# Patient Record
Sex: Male | Born: 1965 | Race: White | Hispanic: No | Marital: Married | Smoking: Never smoker
Health system: Southern US, Community
[De-identification: ages and names within clinical notes are randomized; demographics above are authoritative.]

## PROBLEM LIST (undated history)

## (undated) DIAGNOSIS — Z8489 Family history of other specified conditions: Secondary | ICD-10-CM

## (undated) DIAGNOSIS — T7840XA Allergy, unspecified, initial encounter: Secondary | ICD-10-CM

## (undated) DIAGNOSIS — J189 Pneumonia, unspecified organism: Secondary | ICD-10-CM

## (undated) DIAGNOSIS — M1990 Unspecified osteoarthritis, unspecified site: Secondary | ICD-10-CM

## (undated) DIAGNOSIS — R7303 Prediabetes: Secondary | ICD-10-CM

## (undated) HISTORY — DX: Unspecified osteoarthritis, unspecified site: M19.90

## (undated) HISTORY — DX: Allergy, unspecified, initial encounter: T78.40XA

## (undated) HISTORY — PX: THUMB AMPUTATION: SHX804

---

## 2006-11-01 ENCOUNTER — Ambulatory Visit: Payer: Self-pay | Admitting: Family Medicine

## 2006-11-05 ENCOUNTER — Encounter: Admission: RE | Admit: 2006-11-05 | Discharge: 2006-11-05 | Payer: Self-pay | Admitting: Family Medicine

## 2010-11-03 ENCOUNTER — Ambulatory Visit: Payer: Self-pay | Admitting: Family Medicine

## 2010-11-03 DIAGNOSIS — J309 Allergic rhinitis, unspecified: Secondary | ICD-10-CM | POA: Insufficient documentation

## 2010-11-03 DIAGNOSIS — R21 Rash and other nonspecific skin eruption: Secondary | ICD-10-CM | POA: Insufficient documentation

## 2010-11-03 DIAGNOSIS — M1990 Unspecified osteoarthritis, unspecified site: Secondary | ICD-10-CM | POA: Insufficient documentation

## 2010-11-06 ENCOUNTER — Encounter (INDEPENDENT_AMBULATORY_CARE_PROVIDER_SITE_OTHER): Payer: Self-pay | Admitting: *Deleted

## 2010-11-07 LAB — CONVERTED CEMR LAB
BUN: 14 mg/dL (ref 6–23)
Calcium: 9.3 mg/dL (ref 8.4–10.5)
Creatinine, Ser: 0.89 mg/dL (ref 0.40–1.50)
Sodium: 138 meq/L (ref 135–145)

## 2010-11-24 ENCOUNTER — Ambulatory Visit: Payer: Self-pay | Admitting: Family Medicine

## 2010-11-24 DIAGNOSIS — R7309 Other abnormal glucose: Secondary | ICD-10-CM | POA: Insufficient documentation

## 2010-11-30 ENCOUNTER — Encounter (INDEPENDENT_AMBULATORY_CARE_PROVIDER_SITE_OTHER): Payer: Self-pay | Admitting: *Deleted

## 2010-11-30 DIAGNOSIS — E119 Type 2 diabetes mellitus without complications: Secondary | ICD-10-CM | POA: Insufficient documentation

## 2010-12-05 ENCOUNTER — Ambulatory Visit: Payer: Self-pay | Admitting: Family Medicine

## 2010-12-05 LAB — HM DIABETES FOOT EXAM

## 2011-01-23 NOTE — Miscellaneous (Signed)
  Clinical Lists Changes  Problems: Added new problem of DIABETES MELLITUS (ICD-250.00)

## 2011-01-23 NOTE — Letter (Signed)
Summary: Generic Letter  [place] [facility]  [address] [place], [place] [address]   Phone: [phone]  Fax: [phone]    11/30/2010    Nedim Sabino [address] [place], [place]  [address]    Dear Mr. Flax,  We have been unable to reach you by phone.  If your phone number has changed, please notify our office as it is important that we be able to contact  you if necessary. Numerous messages have been left on your voicemail with no response.  Your blood sugar is elevated.  You have Diabetes Mellitus. You need a  30 minute  appointment with me to discuss it.  You do not need to come in fasting.  Please call the office to schedule that appointment and be sure to ask the receptionist to make it a 30 minute block of time to allow adequate time to speak with you.   Sincerely,     Dwana Curd. Para March, M.D.  [facility]

## 2011-01-23 NOTE — Assessment & Plan Note (Signed)
Summary: REESTABH AND FOOT PROBLEMS/DLO   Vital Signs:  Patient profile:   45 year old male Height:      72 inches Weight:      284.75 pounds BMI:     38.76 Temp:     98.4 degrees F oral Pulse rate:   88 / minute Pulse rhythm:   regular BP sitting:   124 / 70  (left arm) Cuff size:   large  Vitals Entered By: Delilah Shan CMA Duncan Dull) (November 03, 2010 3:43 PM) CC: Re-establish.  Foot problem   History of Present Illness: B knee OA, taking naproxen often, up to 3 aleve mult times a day.    B foot abrasion.  B new boots, steel toe.  Started to get irritated 2-3 weeks ago.  H/o foot sweating.  prev cracked chapped and bleeding.  R>L foot symptoms.  On top of foot.   No fevers.  No purulent discharge.   Preventive Screening-Counseling & Management  Alcohol-Tobacco     Smoking Status: never  Caffeine-Diet-Exercise     Does Patient Exercise: no      Drug Use:  no.    Current Medications (verified): 1)  Naproxen Sodium 220 Mg Tabs (Naproxen Sodium) .... Daily 2)  Triamcinolone Acetonide 0.5 % Crea (Triamcinolone Acetonide) .... Aaa Two Times A Day 3)  Tramadol Hcl 50 Mg Tabs (Tramadol Hcl) .Marland Kitchen.. 1 By Mouth Two To Three Times A Day For Knee Pain.  Allergies (verified): 1)  ! Penicillin  Past History:  Past Medical History: OSTEOARTHRITIS (ICD-715.90) ALLERGIC RHINITIS (ICD-477.9)    Past Surgical History: L thumb reattached in HS  Family History: Reviewed history and no changes required. Family History Lung cancer, parents M dead, lung CA F dead, COPD, suicide 5 brothers, 1 with CAD, 1 with COPD  Social History: Reviewed history and no changes required. Occupation:  Psychologist, occupational Education:  [employer] Single Never Smoked Alcohol use-yes, rare Drug use-no Regular exercise-no enjoys drag racing lives with 2 brothersOccupation:  employed Smoking Status:  never Drug Use:  no Does Patient Exercise:  no  Review of Systems       See HPI.  Otherwise negative.      Physical Exam  General:  no apparent distress mucous membranes moist  regular rate and rhythm  clear to auscultation bilaterally  knees with medial joint line tenderness bilaterally, + crepitus bilaterally but no erythema B feet with sensation intact and good cap refill but irritated, erythematous, excoriated plaque on the dorsum of the midfoot, R>L   Impression & Recommendations:  Problem # 1:  OSTEOARTHRITIS (ICD-715.90) Start tramadol and limit NSAIDS.  check bmet given nsaid use.  His updated medication list for this problem includes:    Naproxen Sodium 220 Mg Tabs (Naproxen sodium) .Marland Kitchen... Daily    Tramadol Hcl 50 Mg Tabs (Tramadol hcl) .Marland Kitchen... 1 by mouth two to three times a day for knee pain.  Orders: T-Basic Metabolic Panel [id number])  Problem # 2:  SKIN RASH (ICD-782.1) Likely due to irritation, sweat, scratching.  No indication for antibiotics at this point.  use TAC and call back with update as needed.  he agrees.  His updated medication list for this problem includes:    Triamcinolone Acetonide 0.5 % Crea (Triamcinolone acetonide) .Marland Kitchen... Aaa two times a day  Complete Medication List: 1)  Naproxen Sodium 220 Mg Tabs (Naproxen sodium) .... Daily 2)  Triamcinolone Acetonide 0.5 % Crea (Triamcinolone acetonide) .... Aaa two times a day 3)  Tramadol Hcl  50 Mg Tabs (Tramadol hcl) .Marland Kitchen.. 1 by mouth two to three times a day for knee pain.  Patient Instructions: 1)  I would use the cream two times a day.  Don't use alcohol or peroxide on it.  Change your socks at work in the middle of the day.  2)  I would use the tramadol for pain as directed.  Don't take more than 4 aleve in a day.   The tramadol may make you drowsy.   3)  I'll let you know about your labs.  Prescriptions: TRAMADOL HCL 50 MG TABS (TRAMADOL HCL) 1 by mouth two to three times a day for knee pain.  #50 x 1   Entered and Authorized by:   Crawford Givens MD   Signed by:   Crawford Givens MD on 11/03/2010   Method  used:   [facility]        [facility]  [address] (retail)       [address]       [place], [place]  [address]       Ph: [phone] or [phone]       Fax: [phone]   RxID:   [id number] TRIAMCINOLONE ACETONIDE 0.5 % CREA (TRIAMCINOLONE ACETONIDE) AAA two times a day  #80g x 1   Entered and Authorized by:   Crawford Givens MD   Signed by:   Crawford Givens MD on 11/03/2010   Method used:   [facility]        [facility]  [id number]       [address]       [place], [place]  [address]       Ph: [phone] or [phone]       Fax: [phone]   RxID:   [id number]    Orders Added: 1)  Est. Patient Level III [20254] 2)  T-Basic Metabolic Panel [id number]    Current Allergies (reviewed today): ! PENICILLIN

## 2011-01-23 NOTE — Letter (Signed)
Summary: Generic Letter  [place] at [facility]  [address] [place], [place] [address]   Phone: [phone]  Fax: [phone]    11/06/2010    Sherman Aja [address] [place], [place]  [address]    Dear Mr. Hartzell,  We have been unable to reach you by phone.  If your phone number has changed, please notify our office as it is important that we be able to contact  you if necessary.   Your labs are fine except for glucose which is elevated.  You need to come in for fasting glucose. Please call the office to set up a lab appointment only and you will need to have nothing to eat or drink after midnight on the night before your lab appointment.  You may have water or black coffee (no cream or sugar or any artificial sweeteners).  [phone].       Sincerely,   Dwana Curd. Para March, M.D.  [facility]

## 2011-01-25 NOTE — Assessment & Plan Note (Signed)
Summary: 30 MIN PER MD/DLO   Vital Signs:  Patient profile:   45 year old male Height:      72 inches Weight:      282.25 pounds BMI:     38.42 Temp:     98.2 degrees F oral Pulse rate:   84 / minute Pulse rhythm:   regular BP sitting:   122 / 70  (left arm) Cuff size:   large  Vitals Entered By: Delilah Shan CMA Duncan Dull) (December 05, 2010 4:09 PM) CC: 30 minute OV per MD   History of Present Illness: New DM2- 30 lbs intentional weight loss.  portion control and cutting out soda.  Moving better with less weight.  We talked about path/phys of DM today and he understood the basic goals.  We talked about diet, exercise and weight loss.  He knows to avoid sugary, high carb, low fiber foods.  No symptoms currently.  No polyphagia, no polydypsia.   Allergies: 1)  ! Penicillin  Past History:  Past Medical History: OSTEOARTHRITIS (ICD-715.90) ALLERGIC RHINITIS (ICD-477.9)   Diabetes mellitus, type II  Review of Systems       See HPI.  Otherwise negative.    Physical Exam  General:  NAD MMM RRR clear to auscultation bilaterally ext w/o edema.   Diabetes Management Exam:    Foot Exam (with socks and/or shoes not present):       Sensory-Pinprick/Light touch:          Left medial foot (L-4): normal          Left dorsal foot (L-5): normal          Left lateral foot (S-1): normal          Right medial foot (L-4): normal          Right dorsal foot (L-5): normal          Right lateral foot (S-1): normal       Sensory-Monofilament:          Left foot: normal          Right foot: normal       Inspection:          Left foot: normal          Right foot: abnormal             Comments: rash much improved from before, minimal erythema from chapped skin       Nails:          Left foot: normal          Right foot: normal   Impression & Recommendations:  Problem # 1:  DIABETES MELLITUS (ICD-250.00) Assessment New meter rx hand written and instructions given about check  sugar.  he declined referral for DM2 education.  I talked to him about ADA website.  He'll look at that.  Work on diet and exercise in the meantime and we'll notify him about A1c.  See instructions.  No new meds today. >25 min spent with patient, at least half of which was spent on counseling re:dx.  Orders: TLB-A1C / Hgb A1C (Glycohemoglobin) (83036-A1C)  Complete Medication List: 1)  Naproxen Sodium 220 Mg Tabs (Naproxen sodium) .... Daily 2)  Triamcinolone Acetonide 0.5 % Crea (Triamcinolone acetonide) .... Aaa two times a day 3)  Tramadol Hcl 50 Mg Tabs (Tramadol hcl) .Marland Kitchen.. 1 by mouth two to three times a day for knee pain.  Patient Instructions: 1)  We'll contact you with your  lab report.  I want to recheck an A1c on you in 6 months with a OV a few days later. 2)  Check the American Diabetes Association website about food and exercise ideas.  3)  Check your sugar occasionally in the AM and let me know if the numbers are getting higher.  4)  Take care.  Glad to see you today.    Orders Added: 1)  Est. Patient Level IV [56213] 2)  TLB-A1C / Hgb A1C (Glycohemoglobin) [83036-A1C]    Current Allergies (reviewed today): ! PENICILLIN

## 2011-04-03 ENCOUNTER — Other Ambulatory Visit: Payer: Self-pay | Admitting: *Deleted

## 2011-04-03 MED ORDER — GLUCOSE BLOOD VI STRP: ORAL_STRIP | Status: AC

## 2011-05-11 NOTE — Assessment & Plan Note (Signed)
[name]                             [facility] OFFICE NOTE   NAME:[name], [name]                        MRN:          [id number]  DATE:11/01/2006                            DOB:          1966/01/29    [name] is a 45 year old white male who comes to reestablish with the  practice not having been seen since October 28, 2001. He indicates no recent  medical care.   He indicates that he has had back pain with straightening up for the past 1-  2 weeks. He thinks he woke with the pain but he is not sure. The pain  resolves with sitting or lying. He denies any trauma. 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 He  has been taking Naprosyn 220 mg 3 twice a day. He says this helps but does  not relieve.   CURRENT MEDICATIONS:  Nothing other than the OTC Naprosyn.   ALLERGIES:  AUGMENTIN causes palpitations.   PAST MEDICAL HISTORY:  He had pneumonia when he was between 38 and 67 years  old and 2 lumbar bulging disks in 1991-92.   PAST SURGICAL HISTORY:  His only surgery has been reattachment of his left  thumb in 1987. He has had no hospitalizations.   SOCIAL HISTORY:  He is single with no children. He is a Psychologist, occupational and enjoys  drag races. He is in no exercise program. He has never smoked, occasionally  uses alcohol, and does not use street drugs.   REVIEW OF SYSTEMS:  He denies any HEENT, cardiovascular, respiratory, GI or  GU problems. Musculoskeletally, he indicates a fracture of his left upper  and lower leg at age 68. He has low back pain as discussed above. He does  have allergic rhinitis and no history of thromboses.   FAMILY HISTORY:  Father died at age 11 of suicide. He had emphysema, was a  smoker, and had an MI x1. His mother died at age 28 of lung cancer. She was  a smoker and had had osteoporosis. He has 5 brothers all living, one with  prostate cancer and hypertension, a second  brother with bad knees and a  silent MI at approximately age 90. He also had elevated lipids. The third  brother has asthma, brothers 4 and 5 have no known medical problems.   With questions regarding his extended family, he indicates that he has very  little knowledge, but he is not aware of any prostate cancer in the family.   IMMUNIZATIONS:  His last tetanus was approximately 5 years ago. He indicates  that he has had a hepatitis A vaccine but not the B vaccine. He has no  pneumonia vaccine.   PHYSICAL EXAMINATION:  VITAL SIGNS:  Blood pressure 131/86, temperature  98.2, pulse is 102, weight 296, Height 5 feet 9-3/4 inches.  GENERAL:  Obese white male in no acute distress sitting in the exam room.  CHEST:  Clear throughout. No rales, rhonchi or wheezes.  HEART:  Rate and rhythm regular  without murmur, gallop or rub. No carotid  bruits, no pretibial edema.  MUSCULOSKELETAL:  He has pain in his L3 to 4 area with rising from forward  flexion of 90 degrees. The pain occurs at approximately as he stands in an  upright position. He has minimal pain with lateral flexion or with rotation.  There is no pain on straight leg raise, internal or external rotation of the  hip.  SKIN:  Without obvious lesions in the exposed area.  PSYCHIATRIC:  Oriented x3, verbalizes easily.   ASSESSMENT:  1. Low back pain. Plan:  Will get an x-ray of his LS spine, refer as      needed. Skelaxin 800 mg 1 q.i.d. with samples and prescription. He      should use ibuprofen 800 mg every 8 hours or Aleve 2 (Naprosyn) q.12h      with food. I have encouraged him not to overtake medication. He will      use heat/ice/heat or heat as he tolerates it. Will see him back in 7-10      days if not improve.  2. Obesity. He is aware of his weight and indicates that at this point in      time he is not interested in doing anything about it.      Billie D. Bean, FNP  Electronically Signed      Arta Silence, MD   Electronically Signed   BDB/MedQ  DD: 11/12/2006  DT: 11/12/2006  Job #: [id number]

## 2011-05-26 ENCOUNTER — Encounter: Payer: Self-pay | Admitting: Family Medicine

## 2011-06-05 ENCOUNTER — Ambulatory Visit: Payer: Self-pay | Admitting: Family Medicine

## 2011-06-07 ENCOUNTER — Ambulatory Visit (INDEPENDENT_AMBULATORY_CARE_PROVIDER_SITE_OTHER): Payer: 59 | Admitting: Family Medicine

## 2011-06-07 ENCOUNTER — Encounter: Payer: Self-pay | Admitting: Family Medicine

## 2011-06-07 ENCOUNTER — Telehealth: Payer: Self-pay | Admitting: Family Medicine

## 2011-06-07 DIAGNOSIS — Z8639 Personal history of other endocrine, nutritional and metabolic disease: Secondary | ICD-10-CM

## 2011-06-07 DIAGNOSIS — E119 Type 2 diabetes mellitus without complications: Secondary | ICD-10-CM

## 2011-06-07 NOTE — Telephone Encounter (Signed)
Hx updated.

## 2011-06-07 NOTE — Patient Instructions (Signed)
You can get your results through our phone system.  Follow the instructions on the blue card. Take care.  Thanks for you work on your diet and weight.  Glad to see you.  We'll let your know when to come back when I get your labs.

## 2011-06-07 NOTE — Progress Notes (Signed)
Diabetes: lost ~100lbs intentionally Using medications without difficulties:no meds Hypoglycemic episodes:no Hyperglycemic episodes:no Feet problems:no Blood Sugars averaging: 70-90 fasting  PMH and SH reviewed  Meds, vitals, and allergies reviewed.   ROS: See HPI.  Otherwise negative.    GEN: nad, alert and oriented HEENT: mucous membranes moist NECK: supple w/o LA CV: rrr. PULM: ctab, no inc wob ABD: soft, +bs EXT: no edema SKIN: no acute rash  Diabetic foot exam: Normal inspection No skin breakdown No calluses  Normal DP pulses Normal sensation to light touch and monofilament Nails normal

## 2011-06-07 NOTE — Assessment & Plan Note (Signed)
Check A1c.  Profound weight loss with diet and exercise.  I thanked the pt. See notes on labs.

## 2012-09-03 ENCOUNTER — Encounter: Payer: Self-pay | Admitting: Family Medicine

## 2012-09-03 ENCOUNTER — Ambulatory Visit (INDEPENDENT_AMBULATORY_CARE_PROVIDER_SITE_OTHER): Payer: 59 | Admitting: Family Medicine

## 2012-09-03 VITALS — BP 122/74 | HR 76 | Temp 98.5°F | Wt 206.0 lb

## 2012-09-03 DIAGNOSIS — Z209 Contact with and (suspected) exposure to unspecified communicable disease: Secondary | ICD-10-CM

## 2012-09-03 DIAGNOSIS — M549 Dorsalgia, unspecified: Secondary | ICD-10-CM | POA: Insufficient documentation

## 2012-09-03 MED ORDER — TRAMADOL HCL 50 MG PO TABS: ORAL_TABLET | ORAL | Status: DC

## 2012-09-03 NOTE — Assessment & Plan Note (Signed)
Check labs, notify pt as soon as possible via cell when reviewed.  No obvious STI based on exam.  I don't think he has acute bacterial process with URI sx.  He likely had combination of mild viral sx +/- upheaval from recent developments.

## 2012-09-03 NOTE — Assessment & Plan Note (Signed)
occ tramadol use, continue prn.

## 2012-09-03 NOTE — Progress Notes (Signed)
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  Single partner.  No dysuria.  No rash.  No pain.  Never tested prev.  No h/o STDs.  No testicle pain.   Back pain, lower back. Had some relief with tramadol and needed a refill.  No ADE, not drowsy.    Stomach upset- nauseated w/o vomiting.  Some mild cough, no sputum.  No fevers.  No diarrhea.  He has been shocked by the situation described above .    ROS: See HPI.  Otherwise negative.    Meds, vitals, and allergies reviewed.   GEN: nad, alert and oriented HEENT: mucous membranes moist, TM w/o erythema, nasal epithelium minially injected, OP without cobblestoning NECK: supple w/o LA CV: rrr. PULM: ctab, no inc wob ABD: soft, +bs EXT: no edema Testes bilaterally descended without nodularity, tenderness or masses. No scrotal masses or lesions. No penis lesions or urethral discharge.

## 2012-09-03 NOTE — Patient Instructions (Addendum)
Go to the lab on the way out.  We'll contact you with your lab report. Take care.   

## 2012-09-04 LAB — URINALYSIS, MICROSCOPIC ONLY
Bacteria, UA: NONE SEEN
Casts: NONE SEEN
Squamous Epithelial / LPF: NONE SEEN

## 2012-09-04 LAB — GC/CHLAMYDIA PROBE AMP, URINE: GC Probe Amp, Urine: NEGATIVE

## 2012-09-04 LAB — HSV(HERPES SIMPLEX VRS) I + II AB-IGM: Herpes Simplex Vrs I&II-IgM Ab (EIA): 0.36 INDEX

## 2012-09-04 LAB — HIV ANTIBODY (ROUTINE TESTING W REFLEX): HIV: NONREACTIVE

## 2012-09-04 LAB — RPR

## 2013-01-02 ENCOUNTER — Encounter: Payer: Self-pay | Admitting: Family Medicine

## 2013-01-02 ENCOUNTER — Ambulatory Visit (INDEPENDENT_AMBULATORY_CARE_PROVIDER_SITE_OTHER): Payer: 59 | Admitting: Family Medicine

## 2013-01-02 VITALS — BP 122/70 | HR 74 | Temp 98.0°F | Wt 210.0 lb

## 2013-01-02 DIAGNOSIS — M25519 Pain in unspecified shoulder: Secondary | ICD-10-CM

## 2013-01-02 MED ORDER — TRAMADOL HCL 50 MG PO TABS: ORAL_TABLET | ORAL | Status: DC

## 2013-01-02 NOTE — Progress Notes (Signed)
"  Stomach bug" started about 1.5 weeks ago.  Diarrhea and sour belching.  No fevers now, prev resolved. No more diarrhea.  This is now all resolved.   L shoulder bothering him for "awhile".  Waking up at night from pain.  Pain if abduction >90 deg, weakness if working above his head.  Progressive, more discomfort now.  No recent trauma.  Takes tramadol for pain.    He's getting married in October 2014.   Meds, vitals, and allergies reviewed.   ROS: See HPI.  Otherwise, noncontributory.  nad ncat Neck with normal ROM.  L shoulder with dec in ROM due to pain- at 90deg Pain with int>ext rotation, +scap assist Pain on supraspinatus testing + impingement No arm drop Elbow with normal ROM AC not ttp

## 2013-01-02 NOTE — Patient Instructions (Signed)
Use the exercise and the tramadol for pain.  If not improving, then call back and we'll get you set up with ortho.  Take care.

## 2013-01-03 DIAGNOSIS — M25519 Pain in unspecified shoulder: Secondary | ICD-10-CM | POA: Insufficient documentation

## 2013-01-03 NOTE — Assessment & Plan Note (Signed)
With cuff symptoms.  +scap assist.  D/w pt about options- home exercise with tramadol prn vs ortho referral and tramadol.  He would prefer home exercises.  If failed, then refer to ortho.  Anatomy and exercises discussed, handout given.  He agrees.

## 2013-06-16 ENCOUNTER — Other Ambulatory Visit: Payer: Self-pay | Admitting: Family Medicine

## 2013-06-16 NOTE — Telephone Encounter (Signed)
Electronic refill request.  Please advise. 

## 2013-06-17 NOTE — Telephone Encounter (Signed)
Sent!

## 2013-09-22 ENCOUNTER — Telehealth: Payer: Self-pay

## 2013-09-22 NOTE — Telephone Encounter (Signed)
Pt left v/m requesting refill of medication; pt did not leave name of med requesting or pharmacy. Left v/m for pt to call back.

## 2013-09-24 ENCOUNTER — Other Ambulatory Visit: Payer: Self-pay | Admitting: *Deleted

## 2013-09-24 MED ORDER — TRAMADOL HCL 50 MG PO TABS: ORAL_TABLET | ORAL | Status: DC

## 2013-09-24 NOTE — Telephone Encounter (Signed)
Please call in.  Thanks.   

## 2013-09-25 NOTE — Telephone Encounter (Signed)
Medication phoned to pharmacy.  

## 2013-09-30 ENCOUNTER — Telehealth: Payer: Self-pay

## 2013-09-30 NOTE — Telephone Encounter (Signed)
Opened in error

## 2013-10-26 NOTE — Telephone Encounter (Signed)
Tramadol refilled 09/24/13.

## 2014-01-01 ENCOUNTER — Ambulatory Visit (INDEPENDENT_AMBULATORY_CARE_PROVIDER_SITE_OTHER): Payer: 59 | Admitting: Internal Medicine

## 2014-01-01 ENCOUNTER — Encounter: Payer: Self-pay | Admitting: Internal Medicine

## 2014-01-01 VITALS — BP 128/82 | HR 73 | Temp 98.1°F | Wt 223.5 lb

## 2014-01-01 DIAGNOSIS — M129 Arthropathy, unspecified: Secondary | ICD-10-CM

## 2014-01-01 DIAGNOSIS — Z2089 Contact with and (suspected) exposure to other communicable diseases: Secondary | ICD-10-CM

## 2014-01-01 DIAGNOSIS — Z202 Contact with and (suspected) exposure to infections with a predominantly sexual mode of transmission: Secondary | ICD-10-CM

## 2014-01-01 DIAGNOSIS — M1990 Unspecified osteoarthritis, unspecified site: Secondary | ICD-10-CM

## 2014-01-01 DIAGNOSIS — K1370 Unspecified lesions of oral mucosa: Secondary | ICD-10-CM

## 2014-01-01 DIAGNOSIS — K1379 Other lesions of oral mucosa: Secondary | ICD-10-CM

## 2014-01-01 MED ORDER — DICLOFENAC SODIUM 75 MG PO TBEC: 75.0000 mg | DELAYED_RELEASE_TABLET | Freq: Two times a day (BID) | ORAL | Status: DC

## 2014-01-01 NOTE — Patient Instructions (Signed)
Oral Ulcers °Oral ulcers are painful, shallow sores around the lining of the mouth. They can affect the gums, the inside of the lips and the cheeks (sores on the outside of the lips and on the face are different). They typically first occur in school aged children and teenagers. Oral ulcers may also be called canker sores or cold sores. °CAUSES  °Canker sores and cold sores can be caused by many factors including: °· Infection. °· Injury. °· Sun exposure. °· Medications. °· Emotional stress. °· Food allergies. °· Vitamin deficiencies. °· Toothpastes containing sodium lauryl sulfate. °The Herpes Virus can be the cause of mouth ulcers. The first infection can be severe and cause 10 or more ulcers on the gums, tongue and lips with fever and difficulty in swallowing. This infection usually occurs between the ages of 1 and 3 years.  °SYMPTOMS  °The typical sore is about ¼ inch (6 mm) in size, is an oval or round ulcer with red borders. °DIAGNOSIS  °Your caregiver can diagnose simple oral ulcers by examination. Additional testing is usually not required.  °TREATMENT  °Treatment is aimed at pain relief. Generally, oral ulcers resolve by themselves within 1 to 2 weeks without medication and are not contagious unless caused by Herpes (and other viruses). Antibiotics are not effective with mouth sores. Avoid direct contact with others until the ulcer is completely healed. See your caregiver for follow-up care as recommended. Also: °· Offer a soft diet. °· Encourage plenty of fluids to prevent dehydration. Popsicles and milk shakes can be helpful. °· Avoid acidic and salty foods and drinks such as orange juice. °· Infants and young children will often refuse to drink because of pain. Using a teaspoon, cup or syringe to give small amounts of fluids frequently can help prevent dehydration. °· Cold compresses on the face may help reduce pain. °· Pain medication can help control soreness. °· A solution of diphenhydramine mixed  with a liquid antacid can be useful to decrease the soreness of ulcers. Consult a caregiver for the dosing. °· Liquids or ointments with a numbing ingredient may be helpful when used as recommended. °· Older children and teenagers can rinse their mouth with a salt-water mixture (1/2 teaspoonof salt in 8 ounces of water) four times a day. This treatment is uncomfortable but may reduce the time the ulcers are present. °· There are many over the counter throat lozenges and medications available for oral ulcers. There effectiveness has not been studied. °· Consult your medical caregiver prior to using homeopathic treatments for oral ulcers. °SEEK MEDICAL CARE IF:  °· You think your child needs to be seen. °· The pain worsens and you cannot control it. °· There are 4 or more ulcers. °· The lips and gums begin to bleed and crust. °· A single mouth ulcer is near a tooth that is causing a toothache or pain. °· Your child has a fever, swollen face, or swollen glands. °· The ulcers began after starting a medication. °· Mouth ulcers keep re-occurring or last more than 2 weeks. °· You think your child is not taking adequate fluids. °SEEK IMMEDIATE MEDICAL CARE IF:  °· Your child has a high fever. °· Your child is unable to swallow or becomes dehydrated. °· Your child looks or acts very ill. °· An ulcer caused by a chemical your child accidentally put in their mouth. °Document Released: 01/17/2005 Document Revised: 03/03/2012 Document Reviewed: 09/01/2009 °[employer]® Patient Information ©2014 [employer]. ° °

## 2014-01-01 NOTE — Progress Notes (Signed)
Subjective:    Patient ID: [name], male    DOB: August 21, 1966, 48 y.o.   MRN: [id number]  HPI  Pt presents to the clinic today with c/o STD exoposure. He reports that his partner has Herpes. Over the last week they have gotten much better and might even be gone. He has never had any genital or oral lesions prior. He would like to be checked for herpes.  Additionally, he would like to switch from naproxen to diclofenac.  Review of Systems      Past Medical History  Diagnosis Date  . Arthritis     osteoarthritis  . Allergy   . Diabetes mellitus     prev, resolved with weight loss 2012    Current Outpatient Prescriptions  Medication Sig Dispense Refill  . naproxen sodium (ANAPROX) 220 MG tablet as needed. daily      . traMADol (ULTRAM) 50 MG tablet 1 BY MOUTH TWO TO THREE TIMES A DAY FOR KNEE OR BACK PAIN AS NEEDED  50 tablet  2   No current facility-administered medications for this visit.    Allergies  Allergen Reactions  . Penicillins     REACTION: "I thought I was having a heart attack"    Family History  Problem Relation Age of Onset  . Cancer Mother     Lung Cancer  . COPD Father   . Heart disease Brother     CAD  . COPD Brother     History   Social History  . Marital Status: Single    Spouse Name: N/A    Number of Children: N/A  . Years of Education: N/A   Occupational History  . Not on file.   Social History Main Topics  . Smoking status: Never Smoker   . Smokeless tobacco: Never Used  . Alcohol Use: Yes     Comment: occ beer  . Drug Use: No  . Sexual Activity: Not on file   Other Topics Concern  . Not on file   Social History Narrative   Single   Lives with 2 brothers    Enjoys drag racing    No regular exercise     Constitutional: Denies fever, malaise, fatigue, headache or abrupt weight changes.  HEENT: Denies eye pain, eye redness, ear pain, ringing in the ears, wax buildup, runny nose, nasal congestion, bloody nose, or sore  throat. GU: Denies urgency, frequency, pain with urination, burning sensation, blood in urine, odor or discharge.  No other specific complaints in a complete review of systems (except as listed in HPI above).  Objective:   Physical Exam  BP 128/82  Pulse 73  Temp(Src) 98.1 F (36.7 C) (Oral)  Wt 223 lb 8 oz (101.379 kg)  SpO2 99% Wt Readings from Last 3 Encounters:  01/01/14 223 lb 8 oz (101.379 kg)  01/02/13 210 lb (95.255 kg)  09/03/12 206 lb (93.441 kg)    General: Appears his stated age, well developed, well nourished in NAD. Skin: Warm, dry and intact. No rashes, lesions or ulcerations noted. HEENT: Head: normal shape and size; Eyes: sclera white, no icterus, conjunctiva pink, PERRLA and EOMs intact; Ears: Tm's gray and intact, normal light reflex; Nose: mucosa pink and moist, septum midline; Throat/Mouth: Teeth present, mucosa pink and moist, no exudate, lesions or ulcerations noted. Small oral ulcer noted on base of tongue.  BMET    Component Value Date/Time   NA 138 11/03/2010 2144   K 4.1 11/03/2010 2144  CL 103 11/03/2010 2144   CO2 25 11/03/2010 2144   GLUCOSE 160* 11/24/2010 0820   BUN 14 11/03/2010 2144   CREATININE 0.89 11/03/2010 2144   CALCIUM 9.3 11/03/2010 2144    Lipid Panel  No results found for this basename: chol, trig, hdl, cholhdl, vldl, ldlcalc    CBC No results found for this basename: wbc, rbc, hgb, hct, plt, mcv, mch, mchc, rdw, neutrabs, lymphsabs, monoabs, eosabs, basosabs    Hgb A1C Lab Results  Component Value Date   HGBA1C 5.1 06/07/2011         Assessment & Plan:   STD exposure:  Will obtain HSV 1 & @ antibodies I thinks this is just viral oral ulcers He declines screening for other STD as this time  Arthritis:  Naproxen D/c'd Ordered diclofenac  RTC as needed, will call you with the lab results

## 2014-01-01 NOTE — Progress Notes (Signed)
Pre-visit discussion using our clinic review tool. No additional management support is needed unless otherwise documented below in the visit note.  

## 2014-01-04 LAB — HSV(HERPES SMPLX)ABS-I+II(IGG+IGM)-BLD
HERPES SIMPLEX VRS I-IGM AB (EIA): 0.27 {index}
HSV 2 Glycoprotein G Ab, IgG: <0.1 IV

## 2014-03-31 ENCOUNTER — Other Ambulatory Visit: Payer: Self-pay | Admitting: Family Medicine

## 2014-03-31 NOTE — Telephone Encounter (Signed)
Received refill request electronically from pharmacy. Last office visit 01/01/14/acute visit, last refill 09/24/13 #50/2 refills. Is it okay to refill medication?

## 2014-03-31 NOTE — Telephone Encounter (Signed)
Please call in.  Thanks.   

## 2014-03-31 NOTE — Telephone Encounter (Signed)
Phoned in to pharmacy. 

## 2014-05-02 ENCOUNTER — Emergency Department (HOSPITAL_COMMUNITY)
Admission: EM | Admit: 2014-05-02 | Discharge: 2014-05-02 | Disposition: A | Discharge status: Home / Self Care | Payer: 59 | Attending: Emergency Medicine | Admitting: Emergency Medicine

## 2014-05-02 ENCOUNTER — Encounter (HOSPITAL_COMMUNITY): Payer: Self-pay | Admitting: Emergency Medicine

## 2014-05-02 DIAGNOSIS — E119 Type 2 diabetes mellitus without complications: Secondary | ICD-10-CM | POA: Insufficient documentation

## 2014-05-02 DIAGNOSIS — M1990 Unspecified osteoarthritis, unspecified site: Secondary | ICD-10-CM | POA: Insufficient documentation

## 2014-05-02 DIAGNOSIS — K0889 Other specified disorders of teeth and supporting structures: Secondary | ICD-10-CM

## 2014-05-02 DIAGNOSIS — Z880 Allergy status to penicillin: Secondary | ICD-10-CM | POA: Insufficient documentation

## 2014-05-02 DIAGNOSIS — K002 Abnormalities of size and form of teeth: Secondary | ICD-10-CM | POA: Insufficient documentation

## 2014-05-02 DIAGNOSIS — Z79899 Other long term (current) drug therapy: Secondary | ICD-10-CM | POA: Insufficient documentation

## 2014-05-02 DIAGNOSIS — K089 Disorder of teeth and supporting structures, unspecified: Secondary | ICD-10-CM | POA: Insufficient documentation

## 2014-05-02 DIAGNOSIS — K029 Dental caries, unspecified: Secondary | ICD-10-CM | POA: Insufficient documentation

## 2014-05-02 DIAGNOSIS — Z791 Long term (current) use of non-steroidal anti-inflammatories (NSAID): Secondary | ICD-10-CM | POA: Insufficient documentation

## 2014-05-02 DIAGNOSIS — Z792 Long term (current) use of antibiotics: Secondary | ICD-10-CM | POA: Insufficient documentation

## 2014-05-02 MED ORDER — CLINDAMYCIN HCL 150 MG PO CAPS: 300.0000 mg | ORAL_CAPSULE | Freq: Three times a day (TID) | ORAL | Status: DC

## 2014-05-02 MED ORDER — HYDROCODONE-ACETAMINOPHEN 5-325 MG PO TABS: 1.0000 | ORAL_TABLET | Freq: Four times a day (QID) | ORAL | Status: DC | PRN

## 2014-05-02 NOTE — ED Notes (Signed)
Pt. Stated, i have a bad toothache top right.

## 2014-05-02 NOTE — ED Provider Notes (Signed)
CSN: [id number]     Arrival date & time 05/02/14  0803 History   First MD Initiated Contact with Patient 05/02/14 0805     Chief Complaint  Patient presents with  . Dental Pain     (Consider location/radiation/quality/duration/timing/severity/associated sxs/prior Treatment) Patient is a 48 y.o. male presenting with tooth pain. The history is provided by the patient. No language interpreter was used.  Dental Pain Location:  Upper Upper teeth location: R upper 1st premolar. Quality:  Sharp and throbbing Severity:  Moderate Onset quality:  Gradual Duration:  2 days Timing:  Constant Progression:  Worsening Chronicity:  Recurrent Context: dental caries and poor dentition   Context: not abscess, filling still in place and not trauma   Previous work-up:  Dental exam Relieved by: Diclofenac. Worsened by:  Touching and pressure Ineffective treatments:  Rest Associated symptoms: no difficulty swallowing, no drooling, no facial swelling, no fever, no gum swelling, no neck pain, no oral bleeding, no oral lesions and no trismus   Risk factors: diabetes, lack of dental care and periodontal disease     Past Medical History  Diagnosis Date  . Arthritis     osteoarthritis  . Allergy   . Diabetes mellitus     prev, resolved with weight loss 2012   Past Surgical History  Procedure Laterality Date  . Thumb amputation      left thumb reattached in High School   Family History  Problem Relation Age of Onset  . Cancer Mother     Lung Cancer  . COPD Father   . Heart disease Brother     CAD  . COPD Brother    History  Substance Use Topics  . Smoking status: Never Smoker   . Smokeless tobacco: Never Used  . Alcohol Use: Yes     Comment: occ beer    Review of Systems  Constitutional: Negative for fever.  HENT: Positive for dental problem. Negative for drooling, facial swelling and mouth sores.   Musculoskeletal: Negative for neck pain.  All other systems reviewed and are  negative.     Allergies  Penicillins  Home Medications   Prior to Admission medications   Medication Sig Start Date End Date Taking? Authorizing Provider  acetaminophen (TYLENOL) 325 MG tablet Take 650 mg by mouth every 6 (six) hours as needed (tooth ache).   Yes Historical Provider, MD  diclofenac (VOLTAREN) 75 MG EC tablet Take 1 tablet (75 mg total) by mouth 2 (two) times daily. 01/01/14  Yes Nicki Reaperegina Baity, NP  traMADol (ULTRAM) 50 MG tablet Take 50 mg by mouth every 6 (six) hours as needed (knee or back pain).   Yes Historical Provider, MD  clindamycin (CLEOCIN) 150 MG capsule Take 2 capsules (300 mg total) by mouth 3 (three) times daily. May dispense as 150mg  capsules 05/02/14   Antony MaduraKelly Gurney Balthazor, PA-C  HYDROcodone-acetaminophen (NORCO/VICODIN) 5-325 MG per tablet Take 1 tablet by mouth every 6 (six) hours as needed. 05/02/14   Antony MaduraKelly Kamare Caspers, PA-C   BP 128/73  Pulse 62  Temp(Src) 98.3 F (36.8 C) (Oral)  Resp 19  Ht 5' 11.75" (1.822 m)  Wt 240 lb (108.863 kg)  BMI 32.79 kg/m2  SpO2 98%  Physical Exam  Nursing note and vitals reviewed. Constitutional: He is oriented to person, place, and time. He appears well-developed and well-nourished. No distress.  HENT:  Head: Normocephalic and atraumatic.  Right Ear: Hearing, tympanic membrane, external ear and ear canal normal. No mastoid tenderness.  Left Ear: Hearing,  tympanic membrane, external ear and ear canal normal. No mastoid tenderness.  Nose: Nose normal.  Mouth/Throat: Uvula is midline, oropharynx is clear and moist and mucous membranes are normal. No oral lesions. No trismus in the jaw. Abnormal dentition. Dental caries present. No dental abscesses or uvula swelling.    No area of fluctuance or purulent drainage. No trismus or stridor.  Eyes: Conjunctivae and EOM are normal. Pupils are equal, round, and reactive to light. No scleral icterus.  Neck: Normal range of motion. Neck supple.  No nuchal rigidity or meningismus    Cardiovascular: Normal rate, regular rhythm and intact distal pulses.   Pulses:      Radial pulses are 2+ on the left side.  Pulmonary/Chest: Effort normal. No stridor. No respiratory distress.  Musculoskeletal: Normal range of motion.  Neurological: He is alert and oriented to person, place, and time.  Skin: Skin is warm and dry. No rash noted. He is not diaphoretic. No erythema. No pallor.  Psychiatric: He has a normal mood and affect. His behavior is normal.    ED Course  Procedures (including critical care time) Labs Review Labs Reviewed - No data to display  Imaging Review No results found.   EKG Interpretation None      MDM   Final diagnoses:  Dentalgia    Patient with toothache. Symptoms have been recurrent over the last few months, but worsening and constant since yesterday. No gross abscess. No area of fluctuance to suspect drainable abscess. Uvula midline without evidence of peritonsillar abscess. No trismus or stridor. Exam unconcerning for Ludwig's angina or spread of infection. Will treat with clindamycin and pain medicine. Urged patient to follow-up with his dentist. Return precautions provided and patient agreeable to plan with no unaddressed concerns.  Filed Vitals:   05/02/14 0808  BP: 128/73  Pulse: 62  Temp: 98.3 F (36.8 C)  TempSrc: Oral  Resp: 19  Height: 5' 11.75" (1.822 m)  Weight: 240 lb (108.863 kg)  SpO2: 98%        Antony MaduraKelly Hermela Hardt, PA-C 05/02/14 [phone]

## 2014-05-02 NOTE — Discharge Instructions (Signed)
Recommend diclofenac for pain control. You may take Norco as needed for breakthrough pain control/severe pain. Take clindamycin to cover for infection. Followup with your dentist tomorrow.  Dental Pain A tooth ache may be caused by cavities (tooth decay). Cavities expose the nerve of the tooth to air and hot or cold temperatures. It may come from an infection or abscess (also called a boil or furuncle) around your tooth. It is also often caused by dental caries (tooth decay). This causes the pain you are having. DIAGNOSIS  Your caregiver can diagnose this problem by exam. TREATMENT   If caused by an infection, it may be treated with medications which kill germs (antibiotics) and pain medications as prescribed by your caregiver. Take medications as directed.  Only take over-the-counter or prescription medicines for pain, discomfort, or fever as directed by your caregiver.  Whether the tooth ache today is caused by infection or dental disease, you should see your dentist as soon as possible for further care. SEEK MEDICAL CARE IF: The exam and treatment you received today has been provided on an emergency basis only. This is not a substitute for complete medical or dental care. If your problem worsens or new problems (symptoms) appear, and you are unable to meet with your dentist, call or return to this location. SEEK IMMEDIATE MEDICAL CARE IF:   You have a fever.  You develop redness and swelling of your face, jaw, or neck.  You are unable to open your mouth.  You have severe pain uncontrolled by pain medicine. MAKE SURE YOU:   Understand these instructions.  Will watch your condition.  Will get help right away if you are not doing well or get worse. Document Released: 12/10/2005 Document Revised: 03/03/2012 Document Reviewed: 07/28/2008 [facility] Patient Information 2014 [place], [place].

## 2014-05-03 NOTE — ED Provider Notes (Signed)
Medical screening examination/treatment/procedure(s) were performed by non-physician practitioner and as supervising physician I was immediately available for consultation/collaboration.   Megan E Docherty, MD 05/03/14 1648 

## 2014-05-24 ENCOUNTER — Other Ambulatory Visit: Payer: Self-pay | Admitting: Internal Medicine

## 2014-05-24 ENCOUNTER — Other Ambulatory Visit: Payer: Self-pay | Admitting: Family Medicine

## 2014-05-24 NOTE — Telephone Encounter (Signed)
Sent, needs OV.  Thanks.

## 2014-05-24 NOTE — Telephone Encounter (Signed)
Received refill request electronically. Last office visit 01/01/14/acute visit. Is it okay to refill medication?

## 2014-05-24 NOTE — Telephone Encounter (Signed)
Prescription last filled by Nicki Reaper 12/2013 with 2 refills--pt's last office visit with you was 12/2012--please advise

## 2014-05-24 NOTE — Telephone Encounter (Signed)
Please call in, see other refill, needs OV.

## 2014-05-25 NOTE — Telephone Encounter (Signed)
Patient notified by telephone and appointment to be scheduled.

## 2014-05-25 NOTE — Telephone Encounter (Signed)
Patient notified by telephone and appointment made. Rx called to pharmacy as instructed.

## 2014-06-06 ENCOUNTER — Other Ambulatory Visit: Payer: Self-pay | Admitting: Family Medicine

## 2014-06-06 DIAGNOSIS — Z131 Encounter for screening for diabetes mellitus: Secondary | ICD-10-CM

## 2014-06-06 DIAGNOSIS — Z13220 Encounter for screening for lipoid disorders: Secondary | ICD-10-CM

## 2014-06-08 ENCOUNTER — Encounter: Payer: Self-pay | Admitting: *Deleted

## 2014-06-08 ENCOUNTER — Other Ambulatory Visit (INDEPENDENT_AMBULATORY_CARE_PROVIDER_SITE_OTHER): Payer: 59

## 2014-06-08 ENCOUNTER — Encounter: Payer: 59 | Admitting: Family Medicine

## 2014-06-08 DIAGNOSIS — Z13220 Encounter for screening for lipoid disorders: Secondary | ICD-10-CM

## 2014-06-08 DIAGNOSIS — Z131 Encounter for screening for diabetes mellitus: Secondary | ICD-10-CM

## 2014-06-08 LAB — LIPID PANEL
CHOL/HDL RATIO: 4
Cholesterol: 137 mg/dL (ref 0–200)
HDL: 34.7 mg/dL — AB (ref >39.00)
LDL CALC: 93 mg/dL (ref 0–99)
NonHDL: 102.3
TRIGLYCERIDES: 47 mg/dL (ref 0.0–149.0)
VLDL: 9.4 mg/dL (ref 0.0–40.0)

## 2014-06-08 LAB — GLUCOSE, RANDOM: GLUCOSE: 103 mg/dL — AB (ref 70–99)

## 2014-06-15 ENCOUNTER — Encounter: Payer: 59 | Admitting: Family Medicine

## 2014-06-15 ENCOUNTER — Ambulatory Visit (INDEPENDENT_AMBULATORY_CARE_PROVIDER_SITE_OTHER): Payer: 59 | Admitting: Family Medicine

## 2014-06-15 ENCOUNTER — Encounter: Payer: Self-pay | Admitting: Family Medicine

## 2014-06-15 VITALS — BP 110/70 | HR 70 | Temp 98.0°F | Ht 68.75 in | Wt 233.0 lb

## 2014-06-15 DIAGNOSIS — Z0000 Encounter for general adult medical examination without abnormal findings: Secondary | ICD-10-CM

## 2014-06-15 DIAGNOSIS — Z23 Encounter for immunization: Secondary | ICD-10-CM

## 2014-06-15 DIAGNOSIS — Z8639 Personal history of other endocrine, nutritional and metabolic disease: Secondary | ICD-10-CM

## 2014-06-15 DIAGNOSIS — M25512 Pain in left shoulder: Secondary | ICD-10-CM

## 2014-06-15 DIAGNOSIS — M25511 Pain in right shoulder: Secondary | ICD-10-CM

## 2014-06-15 MED ORDER — DICLOFENAC SODIUM 75 MG PO TBEC: DELAYED_RELEASE_TABLET | ORAL | Status: DC

## 2014-06-15 MED ORDER — TRAMADOL HCL 50 MG PO TABS: ORAL_TABLET | ORAL | Status: DC

## 2014-06-15 NOTE — Assessment & Plan Note (Signed)
Routine anticipatory guidance given to patient.  See health maintenance. Tetanus 2015 PNA and shingles not due.   Flu shot prev done.  Colon and prostate cancer screening not due.   Diet and exercise d/w pt.  Sig weight loss in the past, some regained prev.  Diet soda.  Exercise at work.   Living will d/w pt.  His brother Dorene SorrowJerry and also Linton Flemingsamie Alvera equally designated if patient is incapacitated.

## 2014-06-15 NOTE — Patient Instructions (Addendum)
To whom it may concern: [name] came in for a routine physical today.   Take care. Glad to see you.  Let me know if you want to go see ortho at some point.  Keep working on your weight.

## 2014-06-15 NOTE — Assessment & Plan Note (Signed)
Now with minimal elevation of sugar.  D/w pt.  He'll work on weight.  He had regained some of his prev loss.  Labs okay for now.

## 2014-06-15 NOTE — Progress Notes (Signed)
Pre visit review using our clinic review tool, if applicable. No additional management support is needed unless otherwise documented below in the visit note.  CPE- See plan.  Routine anticipatory guidance given to patient.  See health maintenance. Tetanus 2015 PNA and shingles not due.   Flu shot prev done.  Colon and prostate cancer screening not due.   Diet and exercise d/w pt.  Sig weight loss in the past, some regained prev.  Diet soda.  Exercise at work.   Living will d/w pt.  His brother Dorene SorrowJerry and also Linton Flemingsamie Alvera equally designated if patient is incapacitated.    H/o shoulder pain.  Sig lifting at work. B shoulder pain.  Can partially abduct.  Taking meds with some relief.  Diclofenac helps the most.  Offered ortho referral.    H/o DM2, resolve with sig intentional weight loss. Labs d/w pt.    PMH and SH reviewed  Meds, vitals, and allergies reviewed.   ROS: See HPI.  Otherwise negative.    GEN: nad, alert and oriented HEENT: mucous membranes moist NECK: supple w/o LA CV: rrr. PULM: ctab, no inc wob ABD: soft, +bs EXT: no edema SKIN: no acute rash  B shoulder exam the same:pain with abduction >135 deg, pain with int> ext rotation.  + impingement.  No arm drop.  Supraspinatus pos on testing.  Distally nv intact.

## 2014-06-15 NOTE — Assessment & Plan Note (Signed)
B cuff sx. D/w pt.  Continue current meds with GI and sedation cautions.  No ADE.  Offered ortho referral, he'll notify me when desired.

## 2014-06-28 ENCOUNTER — Encounter: Payer: Self-pay | Admitting: Family Medicine

## 2016-12-04 ENCOUNTER — Encounter: Payer: Self-pay | Admitting: Family Medicine

## 2017-09-26 DIAGNOSIS — Z23 Encounter for immunization: Secondary | ICD-10-CM | POA: Diagnosis not present

## 2017-10-24 ENCOUNTER — Ambulatory Visit (INDEPENDENT_AMBULATORY_CARE_PROVIDER_SITE_OTHER): Payer: Commercial Managed Care - PPO | Admitting: Family Medicine

## 2017-10-24 ENCOUNTER — Encounter: Payer: Self-pay | Admitting: Family Medicine

## 2017-10-24 ENCOUNTER — Telehealth: Payer: Self-pay | Admitting: *Deleted

## 2017-10-24 ENCOUNTER — Ambulatory Visit
Admission: RE | Admit: 2017-10-24 | Discharge: 2017-10-24 | Disposition: A | Discharge status: Home / Self Care | Payer: Commercial Managed Care - PPO | Source: Ambulatory Visit | Attending: Family Medicine | Admitting: Family Medicine

## 2017-10-24 VITALS — BP 132/78 | HR 67 | Temp 97.8°F | Wt 260.8 lb

## 2017-10-24 DIAGNOSIS — N50811 Right testicular pain: Secondary | ICD-10-CM | POA: Insufficient documentation

## 2017-10-24 DIAGNOSIS — I861 Scrotal varices: Secondary | ICD-10-CM | POA: Diagnosis not present

## 2017-10-24 DIAGNOSIS — N433 Hydrocele, unspecified: Secondary | ICD-10-CM | POA: Insufficient documentation

## 2017-10-24 MED ORDER — LEVOFLOXACIN 500 MG PO TABS: 500.0000 mg | ORAL_TABLET | Freq: Every day | ORAL | 0 refills | Status: DC

## 2017-10-24 NOTE — Telephone Encounter (Signed)
US of Testicles Bilateral hydrocele, right greater than left. Right epididymus somewhat prominent and hypervascular.  ? Right epididymitis.  Small varicocele bilaterally.  No torsion.    Reported to Dr. Para Marchuncan and patient was advised per Dr. Para Marchuncan that there is some inflammation on the back side of the testicles and antibiotics will be sent to his pharmacy for pickup.  This does not necessarily mean it is an STD.  Patient advised to call if worse but it should gradually get better with ABX and give [name] an update early next week, sooner if worse.

## 2017-10-24 NOTE — Telephone Encounter (Signed)
levaquin rx sent to [facility].  Thanks.

## 2017-10-24 NOTE — Patient Instructions (Addendum)
Marion will call about your referral. Take care.  Glad to see you.  

## 2017-10-24 NOTE — Telephone Encounter (Signed)
Patient advised.

## 2017-10-24 NOTE — Progress Notes (Signed)
He is working on his weight, h/o DM2 that resolved with weight loss in the past, d/w pt.   R testicle swelling.  No known trigger or trauma.  No L sided swelling or pain.  R testicle changes noted about 3 days ago.  Swelling had intermittently gotten better, but not resolved, and is persisting now.  No pain now but was more tender prev.  No burning with urination.  No FCNAVD.  No blood in urine.   Meds, vitals, and allergies reviewed.   ROS: Per HPI unless specifically indicated in ROS section   nad abd soft Clear enlargement of the R testicle w/o hernia noted, minimally ttp. L testicle feels normal, no enlargement.  Normal penile exam.

## 2017-10-25 DIAGNOSIS — N50811 Right testicular pain: Secondary | ICD-10-CM | POA: Insufficient documentation

## 2017-10-25 NOTE — Assessment & Plan Note (Signed)
Mildly tender. Swelling noted. Concern is for epididymitis, orchitis. Sent for ultrasound. No emergent findings but possible epididymitis noted. Would start Levaquin. See result note. Patient was contacted.

## 2020-03-28 ENCOUNTER — Encounter: Payer: Self-pay | Admitting: Family Medicine

## 2020-03-28 ENCOUNTER — Ambulatory Visit (INDEPENDENT_AMBULATORY_CARE_PROVIDER_SITE_OTHER)
Admission: RE | Admit: 2020-03-28 | Discharge: 2020-03-28 | Disposition: A | Discharge status: Home / Self Care | Payer: Commercial Managed Care - PPO | Source: Ambulatory Visit | Attending: Family Medicine | Admitting: Family Medicine

## 2020-03-28 ENCOUNTER — Ambulatory Visit: Payer: Commercial Managed Care - PPO | Admitting: Family Medicine

## 2020-03-28 ENCOUNTER — Other Ambulatory Visit: Payer: Self-pay

## 2020-03-28 VITALS — BP 118/70 | HR 78 | Temp 98.7°F | Wt 257.0 lb

## 2020-03-28 DIAGNOSIS — Z8639 Personal history of other endocrine, nutritional and metabolic disease: Secondary | ICD-10-CM | POA: Diagnosis not present

## 2020-03-28 DIAGNOSIS — S99921A Unspecified injury of right foot, initial encounter: Secondary | ICD-10-CM

## 2020-03-28 DIAGNOSIS — I10 Essential (primary) hypertension: Secondary | ICD-10-CM | POA: Diagnosis not present

## 2020-03-28 DIAGNOSIS — L03119 Cellulitis of unspecified part of limb: Secondary | ICD-10-CM

## 2020-03-28 MED ORDER — DOXYCYCLINE HYCLATE 100 MG PO CAPS: 100.0000 mg | ORAL_CAPSULE | Freq: Two times a day (BID) | ORAL | 0 refills | Status: DC

## 2020-03-28 NOTE — Patient Instructions (Signed)
I will notify you of lab results and foot xray results

## 2020-03-28 NOTE — Progress Notes (Signed)
Subjective:    Patient ID: [name], male    DOB: [birth year], 54 y.o.   MRN: [id number]  HPI Chief Complaint  Patient presents with   Ankle Pain    Injury 10 months ago - tripped on hole in the ground   Stepped in a hole 10 months ago, seems to keep reinjuring. Pain worse with sitting, pain in AM. Limping. Tried a brace which caused increased swelling. Has taken ibuprofen 800 mg with some relief.  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  Rash of hands-he reports that he got into poison ivy about a week ago.  He has been applying topical calamine lotion.  He wears Printmaker for work.  Can wear cotton gloves underneath as needed and can get fresh gloves daily.  His hands have been very itchy and he has been scratching, especially at night in his sleep.  He has not had regular care for several years.  Reports that he does not like to go to the doctor.    Review of Systems Per HPI    Objective:   Physical Exam Vitals reviewed.  Constitutional:      General: He is not in acute distress.    Appearance: He is obese. He is not ill-appearing, toxic-appearing or diaphoretic.     Comments: Appears older than stated age.  HENT:     Head: Normocephalic and atraumatic.  Eyes:     Conjunctiva/sclera: Conjunctivae normal.  Cardiovascular:     Rate and Rhythm: Normal rate.  Pulmonary:     Effort: Pulmonary effort is normal.  Musculoskeletal:     Right ankle: Swelling (generalized) present. Tenderness present over the lateral malleolus. Decreased range of motion (flexion, inversion). Normal pulse.     Right Achilles Tendon: No tenderness.     Comments: Discoloration at base of toes.  Patient reports that this is from applying apple cider vinegar to his skin.  He reports this is chronic.  Thickened and discolored toenails.  Skin:    General: Skin is warm and dry.     Comments: Bilateral hands and forearms covered in topical, pinkish, dried substance begin (questionable  calamine lotion).  Dorsal hands with cracking and weeping.  Neurological:     Mental Status: He is alert.       BP 118/70 (BP Location: Left Arm, Patient Position: Sitting, Cuff Size: Normal)    Pulse 78    Temp 98.7 F (37.1 C) (Temporal)    Wt 287 lb (130.2 kg)    SpO2 100%    BMI 42.69 kg/m   Wt Readings from Last 3 Encounters:  03/28/20 257 lb (116.6 kg)  10/24/17 260 lb 12 oz (118.3 kg)  06/15/14 233 lb (105.7 kg)       Assessment & Plan:  1. Cellulitis of upper extremity, unspecified laterality -Concern for cellulitis of hands.  Discussed with patient, keep clean and dry, wear clean gloves, preferably something breathable for inner layer - doxycycline (VIBRAMYCIN) 100 MG capsule; Take 1 capsule (100 mg total) by mouth 2 (two) times daily.  Dispense: 14 capsule; Refill: 0 --Follow-up if no improvement with treatment  2. Injury of right foot, initial encounter -Chronic pain and swelling, will check x-ray and refer him to orthopedics - DG Foot Complete Right; Future  3. History of hyperglycemia -Discussed importance of follow-up with patient.  Labs were ordered but he had not had anything to drink all day and the phlebotomist was unable to get  blood from him.  He was encouraged to follow-up.  4. Essential hypertension -Blood pressure well controlled today.  Labs per #3.  Encouraged him to schedule a follow-up appointment with his PCP before he leaves the office today.  This visit occurred during the SARS-CoV-2 public health emergency.  Safety protocols were in place, including screening questions prior to the visit, additional usage of staff PPE, and extensive cleaning of exam room while observing appropriate contact time as indicated for disinfecting solutions.      Clarene Reamer, FNP-BC  [facility], [facility]  03/31/2020 10:25 AM

## 2020-03-29 ENCOUNTER — Other Ambulatory Visit: Payer: Self-pay | Admitting: Family Medicine

## 2020-03-29 DIAGNOSIS — S99921A Unspecified injury of right foot, initial encounter: Secondary | ICD-10-CM

## 2020-03-29 DIAGNOSIS — M79671 Pain in right foot: Secondary | ICD-10-CM

## 2020-03-30 ENCOUNTER — Encounter: Payer: Self-pay | Admitting: Emergency Medicine

## 2020-03-30 ENCOUNTER — Emergency Department
Admission: EM | Admit: 2020-03-30 | Discharge: 2020-03-30 | Disposition: A | Discharge status: Home / Self Care | Payer: Commercial Managed Care - PPO | Attending: Student in an Organized Health Care Education/Training Program | Admitting: Student in an Organized Health Care Education/Training Program

## 2020-03-30 ENCOUNTER — Other Ambulatory Visit: Payer: Self-pay

## 2020-03-30 DIAGNOSIS — L249 Irritant contact dermatitis, unspecified cause: Secondary | ICD-10-CM

## 2020-03-30 DIAGNOSIS — Z79899 Other long term (current) drug therapy: Secondary | ICD-10-CM | POA: Diagnosis not present

## 2020-03-30 DIAGNOSIS — R21 Rash and other nonspecific skin eruption: Secondary | ICD-10-CM | POA: Diagnosis present

## 2020-03-30 LAB — CBC WITH DIFFERENTIAL/PLATELET
Abs Immature Granulocytes: 0.02 10*3/uL (ref 0.00–0.07)
Basophils Absolute: 0 10*3/uL (ref 0.0–0.1)
Basophils Relative: 0 %
Eosinophils Absolute: 0.8 10*3/uL — ABNORMAL HIGH (ref 0.0–0.5)
Eosinophils Relative: 9 %
HCT: 46.9 % (ref 39.0–52.0)
Hemoglobin: 16.6 g/dL (ref 13.0–17.0)
Immature Granulocytes: 0 %
Lymphocytes Relative: 30 %
Lymphs Abs: 2.7 10*3/uL (ref 0.7–4.0)
MCH: 32.7 pg (ref 26.0–34.0)
MCHC: 35.4 g/dL (ref 30.0–36.0)
MCV: 92.3 fL (ref 80.0–100.0)
Monocytes Absolute: 0.7 10*3/uL (ref 0.1–1.0)
Monocytes Relative: 8 %
Neutro Abs: 4.9 10*3/uL (ref 1.7–7.7)
Neutrophils Relative %: 53 %
Platelets: 152 10*3/uL (ref 150–400)
RBC: 5.08 MIL/uL (ref 4.22–5.81)
RDW: 13.2 % (ref 11.5–15.5)
WBC: 9.3 10*3/uL (ref 4.0–10.5)
nRBC: 0 % (ref 0.0–0.2)

## 2020-03-30 LAB — SEDIMENTATION RATE: Sed Rate: 5 mm/hr (ref 0–20)

## 2020-03-30 LAB — COMPREHENSIVE METABOLIC PANEL
ALT: 31 U/L (ref 0–44)
AST: 22 U/L (ref 15–41)
Albumin: 3.8 g/dL (ref 3.5–5.0)
Alkaline Phosphatase: 53 U/L (ref 38–126)
Anion gap: 8 (ref 5–15)
BUN: 19 mg/dL (ref 6–20)
CO2: 22 mmol/L (ref 22–32)
Calcium: 8.6 mg/dL — ABNORMAL LOW (ref 8.9–10.3)
Chloride: 104 mmol/L (ref 98–111)
Creatinine, Ser: 0.8 mg/dL (ref 0.61–1.24)
GFR calc Af Amer: >60 mL/min (ref >60)
GFR calc non Af Amer: >60 mL/min (ref >60)
Glucose, Bld: 153 mg/dL — ABNORMAL HIGH (ref 70–99)
Potassium: 3.8 mmol/L (ref 3.5–5.1)
Sodium: 134 mmol/L — ABNORMAL LOW (ref 135–145)
Total Bilirubin: 1 mg/dL (ref 0.3–1.2)
Total Protein: 7.4 g/dL (ref 6.5–8.1)

## 2020-03-30 LAB — LACTIC ACID, PLASMA: Lactic Acid, Venous: 1.5 mmol/L (ref 0.5–1.9)

## 2020-03-30 LAB — C-REACTIVE PROTEIN: CRP: 1 mg/dL — ABNORMAL HIGH (ref <1.0)

## 2020-03-30 MED ORDER — EUCERIN EX CREA: TOPICAL_CREAM | CUTANEOUS | 1 refills | Status: DC | PRN

## 2020-03-30 MED ORDER — SULFAMETHOXAZOLE-TRIMETHOPRIM 800-160 MG PO TABS: 1.0000 | ORAL_TABLET | Freq: Two times a day (BID) | ORAL | 0 refills | Status: DC

## 2020-03-30 MED ORDER — TRIAMCINOLONE ACETONIDE 40 MG/ML IJ SUSP
40.0000 mg | Freq: Once | INTRAMUSCULAR | Status: AC
  Administered 2020-03-30: 40 mg via INTRAMUSCULAR
  Filled 2020-03-30: qty 1

## 2020-03-30 MED ORDER — TRIAMCINOLONE ACETONIDE 0.1 % EX CREA: 1.0000 "application " | TOPICAL_CREAM | Freq: Four times a day (QID) | CUTANEOUS | 3 refills | Status: DC

## 2020-03-30 NOTE — ED Triage Notes (Signed)
Pt started with rash on hands. Hands are swollen and skin cracking open.  Rash extends up arms and they have some swelling as well. Pt also reports rash to penis and perineal area as well as face. Has taken benardryl without relief.  PCP said cellulitis and put on abx but getting worse. At this time does not appear like cellulitis.

## 2020-03-30 NOTE — ED Provider Notes (Signed)
[facility] Emergency Department Provider Note  ____________________________________________  Time seen: Approximately 8:03 PM  I have reviewed the triage vital signs and the nursing notes.   HISTORY  Chief Complaint Rash    HPI [name] is a 54 y.o. male who presents the emergency department for worsening rash to bilateral hands, forearms, groin.  Patient states that he started with maculopapular type rash to his hands.  Patient states that this is happened to him in the past with some of the dyes used in the leather gloves that he wears for work he irritated his hands.  Patient states that he noticed symptoms starting, was seen at his primary care office but had a nurse visit.  Patient states that the nurse believed it was cellulitis and put him on antibiotics.  He states that he took the antibiotics but the rashes only worsen.  He states that now it is caused the hands to dry out, he is having cracking of the skin, it went from being pruritic to slightly painful at this time.  No fevers or chills.  Patient has not had any antihistamines or steroids.         Past Medical History:  Diagnosis Date  . Allergy   . Arthritis    osteoarthritis    Patient Active Problem List   Diagnosis Date Noted  . Right testicular pain 10/25/2017  . Routine general medical examination at a health care facility 06/15/2014  . Shoulder pain 01/03/2013  . Back pain 09/03/2012  . History of hyperglycemia 06/07/2011  . ALLERGIC RHINITIS 11/03/2010  . OSTEOARTHRITIS 11/03/2010  . SKIN RASH 11/03/2010    Past Surgical History:  Procedure Laterality Date  . THUMB AMPUTATION     left thumb reattached in High School    Prior to Admission medications   Medication Sig Start Date End Date Taking? Authorizing Provider  acetaminophen (TYLENOL) 325 MG tablet Take 650 mg by mouth every 6 (six) hours as needed (tooth ache).    [provider]  doxycycline (VIBRAMYCIN)  100 MG capsule Take 1 capsule (100 mg total) by mouth 2 (two) times daily. 03/28/20   Elby Beck, FNP  levofloxacin (LEVAQUIN) 500 MG tablet Take 1 tablet (500 mg total) by mouth daily. 10/24/17   Tonia Ghent, MD  meloxicam (MOBIC) 15 MG tablet Take 15 mg by mouth daily.    [provider]  Skin Protectants, Misc. (EUCERIN) cream Apply topically as needed for dry skin. 03/30/20   Denitra Donaghey, Charline Bills, PA-C  sulfamethoxazole-trimethoprim (BACTRIM DS) 800-160 MG tablet Take 1 tablet by mouth 2 (two) times daily. 03/30/20   Xzavien Harada, Charline Bills, PA-C  triamcinolone cream (KENALOG) 0.1 % Apply 1 application topically 4 (four) times daily. 03/30/20   Andalyn Heckstall, Charline Bills, PA-C    Allergies Penicillins  Family History  Problem Relation Age of Onset  . Cancer Mother        Lung Cancer  . COPD Father   . Heart disease Brother        CAD  . COPD Brother   . Heart disease Brother   . Colon cancer Neg Hx   . Prostate cancer Neg Hx     Social History Social History   Tobacco Use  . Smoking status: Never Smoker  . Smokeless tobacco: Never Used  Substance Use Topics  . Alcohol use: Yes    Comment: occ beer  . Drug use: No     Review of Systems  Constitutional: No fever/chills Eyes: No visual changes. No discharge ENT: No upper respiratory complaints. Cardiovascular: no chest pain. Respiratory: no cough. No SOB. Gastrointestinal: No abdominal pain.  No nausea, no vomiting.   Musculoskeletal: Negative for musculoskeletal pain. Skin: Rash to bilateral forearms, bilateral dorsal aspects of the hand, groin Neurological: Negative for headaches, focal weakness or numbness. 10-point ROS otherwise negative.  ____________________________________________   PHYSICAL EXAM:  VITAL SIGNS: ED Triage Vitals  Enc Vitals Group     BP 03/30/20 1752 133/82     Pulse Rate 03/30/20 1752 77     Resp 03/30/20 1752 16     Temp 03/30/20 1752 98.3 F (36.8 C)     Temp Source  03/30/20 1752 Oral     SpO2 03/30/20 1752 98 %     Weight 03/30/20 1754 254 lb (115.2 kg)     Height 03/30/20 1754 5\' 8"  (1.727 m)     Head Circumference --      Peak Flow --      Pain Score 03/30/20 1758 10     Pain Loc --      Pain Edu? --      Excl. in GC? --      Constitutional: Alert and oriented. Well appearing and in no acute distress. Eyes: Conjunctivae are normal. PERRL. EOMI. Head: Atraumatic. ENT:      Ears:       Nose: No congestion/rhinnorhea.      Mouth/Throat: Mucous membranes are moist.  Neck: No stridor.    Cardiovascular: Normal rate, regular rhythm. Normal S1 and S2.  Good peripheral circulation. Respiratory: Normal respiratory effort without tachypnea or retractions. Lungs CTAB. Good air entry to the bases with no decreased or absent breath sounds. Musculoskeletal: Full range of motion to all extremities. No gross deformities appreciated. Neurologic:  Normal speech and language. No gross focal neurologic deficits are appreciated.  Skin:  Skin is warm, dry and intact. Significant macular papular rash noted to the dorsal aspect of the hand extending up the forearm.  No palmar involvement.  Patient with significant cracking of the skin around the MCP joints, and along the dorsal aspect of the hand.  There is a few areas that have honey crusted drainage on the excoriated portions.  No gross erythema or edema concerning for gross cellulitis however.  Patient is still able to move wrist, elbow, all digits.  Sensation capillary refill intact. Psychiatric: Mood and affect are normal. Speech and behavior are normal. Patient exhibits appropriate insight and judgement.   ____________________________________________   LABS (all labs ordered are listed, but only abnormal results are displayed)  Labs Reviewed  CBC WITH DIFFERENTIAL/PLATELET - Abnormal; Notable for the following components:      Result Value   Eosinophils Absolute 0.8 (*)    All other components within  normal limits  COMPREHENSIVE METABOLIC PANEL - Abnormal; Notable for the following components:   Sodium 134 (*)    Glucose, Bld 153 (*)    Calcium 8.6 (*)    All other components within normal limits  LACTIC ACID, PLASMA  SEDIMENTATION RATE  LACTIC ACID, PLASMA  C-REACTIVE PROTEIN   ____________________________________________  EKG   ____________________________________________  RADIOLOGY   No results found.  ____________________________________________    PROCEDURES  Procedure(s) performed:    Procedures    Medications  triamcinolone acetonide (KENALOG-40) injection 40 mg (has no administration in time range)     ____________________________________________   INITIAL IMPRESSION / ASSESSMENT AND PLAN / ED COURSE  Pertinent  labs & imaging results that were available during my care of the patient were reviewed by me and considered in my medical decision making (see chart for details).  Review of the [place] CSRS was performed in accordance of the [facility] prior to dispensing any controlled drugs.           Patient's diagnosis is consistent with contact dermatitis with overlying staph infection.  Patient presented to emergency department with reports of a rash with satellite contact/irritant dermatitis to the dorsal hands, forearms.  This rash has been worsening.  Patient admitted evaluated and her nurse visited his primary care and was diagnosed with cellulitis.  While patient does have what appears to be an opportunistic impetigo in the cracked skin, patient has findings most consistent with contact/irritant dermatitis.  Patient will be given injection of Kenalog, topical triamcinolone, Eucerin, antibiotic at this time.  Follow-up with dermatology to ensure complete resolution..  Patient is given ED precautions to return to the ED for any worsening or new symptoms.     ____________________________________________  FINAL CLINICAL IMPRESSION(S) / ED  DIAGNOSES  Final diagnoses:  Irritant contact dermatitis, unspecified trigger      NEW MEDICATIONS STARTED DURING THIS VISIT:  ED Discharge Orders         Ordered    triamcinolone cream (KENALOG) 0.1 %  4 times daily     03/30/20 2106    Skin Protectants, Misc. (EUCERIN) cream  As needed     03/30/20 2106    sulfamethoxazole-trimethoprim (BACTRIM DS) 800-160 MG tablet  2 times daily     03/30/20 2106              This chart was dictated using voice recognition software/Dragon. Despite best efforts to proofread, errors can occur which can change the meaning. Any change was purely unintentional.    Racheal Patches, PA-C 03/30/20 2106    Willy Eddy, MD 03/30/20 2111

## 2020-03-30 NOTE — ED Notes (Signed)
Pharmacy contacted to send kenalog injection

## 2020-03-31 ENCOUNTER — Encounter: Payer: Self-pay | Admitting: Family Medicine

## 2020-04-03 ENCOUNTER — Telehealth: Payer: Self-pay | Admitting: Family Medicine

## 2020-04-03 NOTE — Telephone Encounter (Signed)
Please get update on patient after ER eval.  Thanks.  

## 2020-04-04 ENCOUNTER — Ambulatory Visit: Payer: Commercial Managed Care - PPO | Admitting: Physician Assistant

## 2020-04-04 ENCOUNTER — Ambulatory Visit: Payer: Self-pay

## 2020-04-04 ENCOUNTER — Other Ambulatory Visit: Payer: Self-pay

## 2020-04-04 ENCOUNTER — Encounter: Payer: Self-pay | Admitting: Orthopedic Surgery

## 2020-04-04 VITALS — Ht 68.0 in | Wt 251.0 lb

## 2020-04-04 DIAGNOSIS — M25571 Pain in right ankle and joints of right foot: Secondary | ICD-10-CM

## 2020-04-04 NOTE — Progress Notes (Signed)
Office Visit Note   Patient: [name]           Date of Birth: [birth year]           MRN: [id number] Visit Date: 04/04/2020              Requested by: Elby Beck, [address] [place],  [place] [address] PCP: Tonia Ghent, MD  Chief Complaint  Patient presents with  . Right Ankle - Pain      HPI: This is a pleasant gentleman who comes in complaining of right ankle pain.  He thinks he is always had bad ankle but about a year ago he stepped in a hole and twisted his ankle.  Because of Covid he did not have this addressed.  He is now having pain from the ankle radiating into his foot  Assessment & Plan: Visit Diagnoses:  1. Pain in right ankle and joints of right foot     Plan: Discussed options.  He does have some varicosities we have encouraged him to try compression stockings and the appropriate size.  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  Ultimately he may be best served by ankle fusion surgery.  Because of his current job situation he does not think he would be able to take 3 months off from his job but he will take this into consideration  Follow-Up Instructions: No follow-ups on file.   Ortho Exam  Patient is alert, oriented, no adenopathy, well-dressed, normal affect, normal respiratory effort. Right ankle: Mild soft tissue swelling no cellulitis distal pulses are palpable.  He does have some subtalar range of motion which seems to be painless.  Ankle range of motion is rigid and he does not even come to a neutral position.  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  Anterior ankle has a small open lesion which the patient says it is a dermatitis that he has been dealing with  Imaging: No results found. No images are attached to the encounter.  Labs: Lab Results  Component Value Date   HGBA1C 5.1 06/07/2011   HGBA1C 7.5 (H) 12/05/2010   ESRSEDRATE 5 03/30/2020   CRP 1.0 (H) 03/30/2020     Lab Results  Component Value Date   ALBUMIN 3.8 03/30/2020    No results found for: MG No results found for: VD25OH  No results found for: PREALBUMIN CBC EXTENDED Latest Ref Rng & Units 03/30/2020  WBC 4.0 - 10.5 K/uL 9.3  RBC 4.22 - 5.81 MIL/uL 5.08  HGB 13.0 - 17.0 g/dL 16.6  HCT 39.0 - 52.0 % 46.9  PLT 150 - 400 K/uL 152  NEUTROABS 1.7 - 7.7 K/uL 4.9  LYMPHSABS 0.7 - 4.0 K/uL 2.7     Body mass index is 38.16 kg/m.  Orders:  Orders Placed This Encounter  Procedures  . XR Ankle Complete Right   No orders of the defined types were placed in this encounter.    Procedures: No procedures performed  Clinical Data: No additional findings.  ROS:  All other systems negative, except as noted in the HPI. Review of Systems  Objective: Vital Signs: Ht 5\' 8"  (1.727 m)   Wt 251 lb (113.9 kg)   BMI 38.16 kg/m   Specialty Comments:  No specialty comments available.  PMFS History: Patient Active Problem List   Diagnosis Date Noted  . Right testicular pain 10/25/2017  .  Routine general medical examination at a health care facility 06/15/2014  . Shoulder pain 01/03/2013  . Back pain 09/03/2012  . History of hyperglycemia 06/07/2011  . ALLERGIC RHINITIS 11/03/2010  . OSTEOARTHRITIS 11/03/2010  . SKIN RASH 11/03/2010   Past Medical History:  Diagnosis Date  . Allergy   . Arthritis    osteoarthritis    Family History  Problem Relation Age of Onset  . Cancer Mother        Lung Cancer  . COPD Father   . Heart disease Brother        CAD  . COPD Brother   . Heart disease Brother   . Colon cancer Neg Hx   . Prostate cancer Neg Hx     Past Surgical History:  Procedure Laterality Date  . 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 Not on file  Tobacco Use  . Smoking status: Never Smoker  . Smokeless tobacco: Never Used  Substance and Sexual Activity  . Alcohol use:  Yes    Comment: occ beer  . Drug use: No  . Sexual activity: Not on file

## 2020-04-04 NOTE — Telephone Encounter (Signed)
Noted.  Thanks.  Glad to hear.  ? ?

## 2020-04-04 NOTE — Telephone Encounter (Addendum)
Patient says he is doing fine now, the rash has all cleared up.

## 2020-04-05 ENCOUNTER — Encounter: Payer: Self-pay | Admitting: Family Medicine

## 2020-04-05 ENCOUNTER — Ambulatory Visit (INDEPENDENT_AMBULATORY_CARE_PROVIDER_SITE_OTHER): Payer: Commercial Managed Care - PPO | Admitting: Family Medicine

## 2020-04-05 VITALS — BP 114/72 | HR 63 | Temp 97.1°F | Ht 68.0 in | Wt 250.4 lb

## 2020-04-05 DIAGNOSIS — R739 Hyperglycemia, unspecified: Secondary | ICD-10-CM

## 2020-04-05 DIAGNOSIS — Z7189 Other specified counseling: Secondary | ICD-10-CM

## 2020-04-05 DIAGNOSIS — Z0000 Encounter for general adult medical examination without abnormal findings: Secondary | ICD-10-CM | POA: Diagnosis not present

## 2020-04-05 DIAGNOSIS — M25579 Pain in unspecified ankle and joints of unspecified foot: Secondary | ICD-10-CM

## 2020-04-05 DIAGNOSIS — M958 Other specified acquired deformities of musculoskeletal system: Secondary | ICD-10-CM

## 2020-04-05 DIAGNOSIS — Z13220 Encounter for screening for lipoid disorders: Secondary | ICD-10-CM | POA: Diagnosis not present

## 2020-04-05 LAB — HEMOGLOBIN A1C: Hgb A1c MFr Bld: 6.4 % (ref 4.6–6.5)

## 2020-04-05 LAB — LIPID PANEL
Cholesterol: 160 mg/dL (ref 0–200)
HDL: 33.4 mg/dL — ABNORMAL LOW (ref >39.00)
LDL Cholesterol: 108 mg/dL — ABNORMAL HIGH (ref 0–99)
NonHDL: 126.32
Total CHOL/HDL Ratio: 5
Triglycerides: 90 mg/dL (ref 0.0–149.0)
VLDL: 18 mg/dL (ref 0.0–40.0)

## 2020-04-05 LAB — GLUCOSE, RANDOM: Glucose, Bld: 124 mg/dL — ABNORMAL HIGH (ref 70–99)

## 2020-04-05 NOTE — Patient Instructions (Addendum)
Check with your insurance to see if they will cover cologuard.  Go to the lab on the way out.   If you have mychart we'll likely use that to update you.    We'll call about seeing general surgery.  Take care.  Glad to see you.

## 2020-04-05 NOTE — Progress Notes (Signed)
This visit occurred during the SARS-CoV-2 public health emergency.  Safety protocols were in place, including screening questions prior to the visit, additional usage of staff PPE, and extensive cleaning of exam room while observing appropriate contact time as indicated for disinfecting solutions.  CPE- See plan.  Routine anticipatory guidance given to patient.  See health maintenance.  The possibility exists that previously documented standard health maintenance information may have been brought forward from a previous encounter into this note.  If needed, that same information has been updated to reflect the current situation based on today's encounter.    Tetanus 2015 PNA and shingles not due.   Flu shot d/w pt. encouraged. covid vaccine encouraged.  He was skeptical about getting it.  Discussed.  Benefits greatly outweigh risk and he was advised to get the vaccine. Prostate cancer screening and PSA options (with potential risks and benefits of testing vs not testing) were discussed along with recent recs/guidelines.  He declined testing PSA at this point. D/w patient QM:[id number] for colon cancer screening, including IFOB vs. colonoscopy.  Risks and benefits of both were discussed and patient voiced understanding.  Pt elects to check on cologuard.  Diet and exercise d/w pt.  exercise limited by ankle pain.  Diet affected by work schedule.  Living will d/w pt.  His brother Dorene Sorrow and also Linton Flemings equally designated if patient is incapacitated.   Abd hernia.  Midline, more noted as time went by.  Noted when doing sit ups.  Not painful but noted bulge.    He had prev injury on R ankle last year.  He is limping from pain and that is likely causing L leg pain with gait change.  He is seen Ortho in the meantime.  He is considering options about surgery.  He had hand irritation. He could have had contact dermatitis (possibly from gloves; changed gloves in the meantime).  He could have had concurrent  staph and that was treated in the meantime.  He is better.    PMH and SH reviewed  Meds, vitals, and allergies reviewed.   ROS: Per HPI.  Unless specifically indicated otherwise in HPI, the patient denies:  General: fever. Eyes: acute vision changes ENT: sore throat Cardiovascular: chest pain Respiratory: SOB GI: vomiting GU: dysuria Musculoskeletal: acute back pain Derm: acute rash Neuro: acute motor dysfunction Psych: worsening mood Endocrine: polydipsia Heme: bleeding Allergy: hayfever  GEN: nad, alert and oriented HEENT: ncat NECK: supple w/o LA CV: rrr. PULM: ctab, no inc wob ABD: soft, +bs EXT: no edema SKIN: no acute rash, he has some residual pigmentation changes noted on the hands but no acute rash. Central soft abd wall defect.

## 2020-04-06 ENCOUNTER — Telehealth: Payer: Self-pay | Admitting: Family Medicine

## 2020-04-06 ENCOUNTER — Other Ambulatory Visit: Payer: Self-pay | Admitting: Family Medicine

## 2020-04-06 ENCOUNTER — Telehealth: Payer: Self-pay | Admitting: Orthopedic Surgery

## 2020-04-06 DIAGNOSIS — R739 Hyperglycemia, unspecified: Secondary | ICD-10-CM

## 2020-04-06 DIAGNOSIS — M25579 Pain in unspecified ankle and joints of unspecified foot: Secondary | ICD-10-CM | POA: Insufficient documentation

## 2020-04-06 DIAGNOSIS — Z7189 Other specified counseling: Secondary | ICD-10-CM | POA: Insufficient documentation

## 2020-04-06 DIAGNOSIS — M958 Other specified acquired deformities of musculoskeletal system: Secondary | ICD-10-CM | POA: Insufficient documentation

## 2020-04-06 NOTE — Assessment & Plan Note (Addendum)
I will defer to orthopedics.  Discussed with patient.

## 2020-04-06 NOTE — Assessment & Plan Note (Signed)
Living will d/w pt.  His brother Dorene Sorrow and also Linton Flemings equally designated if patient is incapacitated.

## 2020-04-06 NOTE — Telephone Encounter (Signed)
Reasonable to see Dr. Salena Saner when possible. Thanks.

## 2020-04-06 NOTE — Telephone Encounter (Signed)
Patient's wife called.   The option of surgery was discussed at the patient's last appointment and they are now wanting to proceed with it.   Call back: [phone]

## 2020-04-06 NOTE — Telephone Encounter (Signed)
Pt was seen on 04/05/20 for his CPE - mentioned at time of visit about left knee pain Wife states that after his appt he came home and mowed the yard and is now having severe pain in the left knee. Pt is unable to walk without a cane today. Having increased swelling, not radiating down into calf or up into thigh, remaining only in the knee at this time. Pt has been taking Tylenol 1000mg  and Ibuprofen 600mg , alternating, with no relief. Pt has been icing and elevating the knee.   Please advise, thanks.

## 2020-04-06 NOTE — Assessment & Plan Note (Signed)
Refer to general surgery.  Routine cautions given.  No emergent symptoms.

## 2020-04-06 NOTE — Assessment & Plan Note (Signed)
Tetanus 2015 PNA and shingles not due.   Flu shot d/w pt. encouraged. covid vaccine encouraged.  He was skeptical about getting it.  Discussed.  Benefits greatly outweigh risk and he was advised to get the vaccine. Prostate cancer screening and PSA options (with potential risks and benefits of testing vs not testing) were discussed along with recent recs/guidelines.  He declined testing PSA at this point. D/w patient GB:[id number] for colon cancer screening, including IFOB vs. colonoscopy.  Risks and benefits of both were discussed and patient voiced understanding.  Pt elects to check on cologuard.  Diet and exercise d/w pt.  exercise limited by ankle pain.  Diet affected by work schedule.  Living will d/w pt.  His brother [name] and also [name] equally designated if patient is incapacitated.

## 2020-04-06 NOTE — Telephone Encounter (Signed)
Pt was in the office on 04/04/20 and had discussed having ankle fusion. Pt would like to proceed did you fill out a blue sheet for this? Ok to schedule

## 2020-04-07 ENCOUNTER — Encounter: Payer: Self-pay | Admitting: Family Medicine

## 2020-04-07 NOTE — Telephone Encounter (Signed)
Contacted pt and advised pt should see Dr. Patsy Lager. Dr. Patsy Lager will not be in until next week. Advised pt and he said if he had to wait until next week he would just go to the ER due to the pain. Advised to go to the [facility]. Pt said he was not interested in that. Offered apt this wk with PCP and pt said he does not want to see anyone right now. Advised if he changes his mind to let this office know. Pt verbalized understanding.

## 2020-04-07 NOTE — Telephone Encounter (Signed)
Noted. Thanks for offering mult options.

## 2020-04-07 NOTE — Telephone Encounter (Signed)
Discuss with pt on the phone. This is documented in the telephone encounter from yesterday.

## 2020-04-08 ENCOUNTER — Encounter: Payer: Self-pay | Admitting: Family Medicine

## 2020-04-08 NOTE — Telephone Encounter (Signed)
Yes , Blue sheet completed

## 2020-04-11 ENCOUNTER — Other Ambulatory Visit: Payer: Self-pay

## 2020-04-11 ENCOUNTER — Telehealth: Payer: Self-pay | Admitting: Family Medicine

## 2020-04-11 ENCOUNTER — Emergency Department
Admission: EM | Admit: 2020-04-11 | Discharge: 2020-04-11 | Disposition: A | Discharge status: Home / Self Care | Payer: Commercial Managed Care - PPO | Attending: Emergency Medicine | Admitting: Emergency Medicine

## 2020-04-11 ENCOUNTER — Encounter: Payer: Self-pay | Admitting: Family Medicine

## 2020-04-11 ENCOUNTER — Emergency Department: Payer: Commercial Managed Care - PPO

## 2020-04-11 DIAGNOSIS — M25562 Pain in left knee: Secondary | ICD-10-CM | POA: Insufficient documentation

## 2020-04-11 DIAGNOSIS — M7122 Synovial cyst of popliteal space [Baker], left knee: Secondary | ICD-10-CM | POA: Insufficient documentation

## 2020-04-11 DIAGNOSIS — M1712 Unilateral primary osteoarthritis, left knee: Secondary | ICD-10-CM | POA: Insufficient documentation

## 2020-04-11 NOTE — Telephone Encounter (Signed)
I spoke with pt; pt is having rt ankle pain and lt knee pain; cannot bear weight due to rt ankle fx and now lt knee pain; pt has not been able to hear back from ortho for last few days. Lt knee has been bad for years but on 04/05/20 pt was mowing and the lt knee just went out. Pt has not been able to work due to joints. Pt said pain level now is 10. Pt's wife will take pt to [facility] ED by car. Pt appreciative and pt wants Dr Para March to know that he has lost 14 lbs since he saw Dr Para March on 04/05/20.

## 2020-04-11 NOTE — Discharge Instructions (Signed)
Follow discharge care instructions and continue previous medication.  Wear knee support as needed.  Follow-up with orthopedic listed in your discharge care instructions.

## 2020-04-11 NOTE — Telephone Encounter (Signed)
See following phone note with triage.

## 2020-04-11 NOTE — ED Triage Notes (Addendum)
Pt comes via POV from home with c/o left knee pain. Pt states he was mowing and his knee just gave out.  Pt states pain and soreness. Pt states more pain when trying to bear weight on knee.  Pt states hx of knee problems

## 2020-04-11 NOTE — ED Provider Notes (Signed)
[facility] Emergency Department Provider Note   ____________________________________________   First MD Initiated Contact with Patient 04/11/20 1206     (approximate)  I have reviewed the triage vital signs and the nursing notes.   HISTORY  Chief Complaint Knee Pain    HPI [name] is a 54 y.o. male patient presents with acute onset of left knee pain.  Patient he was mowing the lawn today when his knee "gave out".  Patient state  has been compensating with the left knee due to  pending right ankle surgery.  Right ankle pain as a 5/10.  Described pain as "achy".  No palliative measure prior to arrival.         Past Medical History:  Diagnosis Date  . Allergy   . Arthritis    osteoarthritis    Patient Active Problem List   Diagnosis Date Noted  . Advance care planning 04/06/2020  . Abdominal wall defect, acquired 04/06/2020  . Ankle pain 04/06/2020  . Routine general medical examination at a health care facility 06/15/2014  . Shoulder pain 01/03/2013  . Back pain 09/03/2012  . History of hyperglycemia 06/07/2011  . ALLERGIC RHINITIS 11/03/2010  . OSTEOARTHRITIS 11/03/2010  . SKIN RASH 11/03/2010    Past Surgical History:  Procedure Laterality Date  . THUMB AMPUTATION     left thumb reattached in High School    Prior to Admission medications   Medication Sig Start Date End Date Taking? Authorizing Provider  acetaminophen (TYLENOL) 325 MG tablet Take 650 mg by mouth every 6 (six) hours as needed (tooth ache).    [provider]  meloxicam (MOBIC) 15 MG tablet Take 15 mg by mouth daily.    [provider]  Skin Protectants, Misc. (EUCERIN) cream Apply topically as needed for dry skin. 03/30/20   Cuthriell, Delorise Royals, PA-C  sulfamethoxazole-trimethoprim (BACTRIM DS) 800-160 MG tablet Take 1 tablet by mouth 2 (two) times daily. 03/30/20   Cuthriell, Delorise Royals, PA-C  triamcinolone cream (KENALOG) 0.1 % Apply 1  application topically 4 (four) times daily. 03/30/20   Cuthriell, Delorise Royals, PA-C    Allergies Penicillins  Family History  Problem Relation Age of Onset  . Cancer Mother        Lung Cancer  . COPD Father   . Heart disease Brother        CAD  . COPD Brother   . Heart disease Brother   . Colon cancer Neg Hx   . Prostate cancer Neg Hx     Social History Social History   Tobacco Use  . Smoking status: Never Smoker  . Smokeless tobacco: Never Used  Substance Use Topics  . Alcohol use: Yes    Comment: occ beer  . Drug use: No    Review of Systems Constitutional: No fever/chills Eyes: No visual changes. ENT: No sore throat. Cardiovascular: Denies chest pain. Respiratory: Denies shortness of breath. Gastrointestinal: No abdominal pain.  No nausea, no vomiting.  No diarrhea.  No constipation. Genitourinary: Negative for dysuria. Musculoskeletal: Positive for right ankle and left knee pain. Skin: Negative for rash. Neurological: Negative for headaches, focal weakness or numbness. Allergic/Immunilogical: Penicillin  ____________________________________________   PHYSICAL EXAM:  VITAL SIGNS: ED Triage Vitals  Enc Vitals Group     BP 04/11/20 1148 136/75     Pulse Rate 04/11/20 1148 88     Resp 04/11/20 1148 20     Temp 04/11/20 1148 98 F (36.7 C)  Temp Source 04/11/20 1148 Oral     SpO2 04/11/20 1148 100 %     Weight 04/11/20 1149 245 lb (111.1 kg)     Height 04/11/20 1149 5\' 8"  (1.727 m)     Head Circumference --      Peak Flow --      Pain Score 04/11/20 1152 5     Pain Loc --      Pain Edu? --      Excl. in GC? --    Constitutional: Alert and oriented. Well appearing and in no acute distress. Cardiovascular: Normal rate, regular rhythm. Grossly normal heart sounds.  Good peripheral circulation. Respiratory: Normal respiratory effort.  No retractions. Lungs CTAB. Musculoskeletal: No obvious deformity or effusion to the left knee.  Patient is moderate  guarding palpation at the tibial plateau.  Neurologic:  Normal speech and language. No gross focal neurologic deficits are appreciated. No gait instability. Skin:  Skin is warm, dry and intact. No rash noted.  No edema or erythema. Psychiatric: Mood and affect are normal. Speech and behavior are normal.  ____________________________________________   LABS (all labs ordered are listed, but only abnormal results are displayed)  Labs Reviewed - No data to display ____________________________________________  EKG   ____________________________________________  RADIOLOGY  ED MD interpretation:    Official radiology report(s): DG Knee Complete 4 Views Left  Result Date: 04/11/2020 CLINICAL DATA:  Onset left knee pain and weakness while pushing a lawn mower today. EXAM: LEFT KNEE - COMPLETE 4+ VIEW COMPARISON:  None. FINDINGS: There is no acute bony or joint abnormality. The patient has advanced tricompartmental osteoarthritis. Joint space narrowing is worst in the medial compartment. A 1 cm in diameter calcification projecting in the posterior soft tissues of the knee is consistent with a loose body in a Baker's cyst. A loose body versus a prominent osteophyte along the posterior aspect of the measures 0.9 cm in diameter. No joint effusion or chondrocalcinosis. IMPRESSION: No acute abnormality. Advanced for age tricompartmental osteoarthritis. 1 cm in diameter loose body in a Baker's cyst. Electronically Signed   By: 04/13/2020 M.D.   On: 04/11/2020 13:17    ____________________________________________   PROCEDURES  Procedure(s) performed (including Critical Care):  Procedures   ____________________________________________   INITIAL IMPRESSION / ASSESSMENT AND PLAN / ED COURSE  As part of my medical decision making, I reviewed the following data within the electronic MEDICAL RECORD NUMBER     Patient presents with cute onset of left knee pain this morning.  Discussed x-ray  findings with patient consistent tricompartmental arthritis and Baker's cyst.  Patient given discharge care instruction placed in a knee immobilizer.  Patient advised follow-up orthopedics for definitive evaluation and treatment.    04/13/2020 was evaluated in Emergency Department on 04/11/2020 for the symptoms described in the history of present illness. He was evaluated in the context of the global COVID-19 pandemic, which necessitated consideration that the patient might be at risk for infection with the SARS-CoV-2 virus that causes COVID-19. Institutional protocols and algorithms that pertain to the evaluation of patients at risk for COVID-19 are in a state of rapid change based on information released by regulatory bodies including the CDC and federal and state organizations. These policies and algorithms were followed during the patient's care in the ED.       ____________________________________________   FINAL CLINICAL IMPRESSION(S) / ED DIAGNOSES  Final diagnoses:  Acute pain of left knee  Tricompartment osteoarthritis of left knee  Baker's  cyst of knee, left     ED Discharge Orders    None       Note:  This document was prepared using Dragon voice recognition software and may include unintentional dictation errors.    Sable Feil, PA-C 04/11/20 1338    Earleen Newport, MD 04/11/20 [phone]

## 2020-04-11 NOTE — ED Notes (Signed)
Pt discharged home after verbalizing understanding of discharge instructions; nad noted. 

## 2020-04-11 NOTE — Telephone Encounter (Signed)
Please triage this patient.  See mychart message.  I need more info.  He has seen ortho about his ankle.  I don't know if they can get him in about his knee.

## 2020-04-11 NOTE — Telephone Encounter (Signed)
Duly noted, I'll await the ER notes.  Thanks.

## 2020-04-13 NOTE — Telephone Encounter (Signed)
I saw that he was referred to orthopedics.  I hope he feels better soon.  If he continues to lose weight or if this is unintentional weight loss then he needs evaluation for that.  Otherwise I will await the orthopedic notes.  Thanks.

## 2020-04-14 ENCOUNTER — Telehealth: Payer: Self-pay | Admitting: Orthopedic Surgery

## 2020-04-14 ENCOUNTER — Other Ambulatory Visit: Payer: Self-pay

## 2020-04-14 NOTE — Telephone Encounter (Signed)
Patient's girlfriend call.   They are requesting a note be sent to the patient's job taking him out of work for now until done recovering from surgery.   Attention: [employer] fax: [phone]

## 2020-04-14 NOTE — Telephone Encounter (Signed)
Noted. Thanks.

## 2020-04-14 NOTE — Telephone Encounter (Signed)
Letter written and faxed as requested.

## 2020-04-14 NOTE — Telephone Encounter (Signed)
Patient says his weight loss is intentional and he is having orthopedic surgery next week.

## 2020-04-18 ENCOUNTER — Other Ambulatory Visit: Payer: Self-pay | Admitting: Physician Assistant

## 2020-04-18 ENCOUNTER — Other Ambulatory Visit (HOSPITAL_COMMUNITY)
Admission: RE | Admit: 2020-04-18 | Discharge: 2020-04-18 | Disposition: A | Discharge status: Home / Self Care | Payer: Commercial Managed Care - PPO | Source: Ambulatory Visit | Attending: Orthopedic Surgery | Admitting: Orthopedic Surgery

## 2020-04-18 DIAGNOSIS — Z01812 Encounter for preprocedural laboratory examination: Secondary | ICD-10-CM | POA: Diagnosis present

## 2020-04-18 DIAGNOSIS — Z20822 Contact with and (suspected) exposure to covid-19: Secondary | ICD-10-CM | POA: Diagnosis not present

## 2020-04-18 LAB — SARS CORONAVIRUS 2 (TAT 6-24 HRS): SARS Coronavirus 2: NEGATIVE

## 2020-04-19 ENCOUNTER — Encounter (HOSPITAL_COMMUNITY): Payer: Self-pay | Admitting: Orthopedic Surgery

## 2020-04-19 ENCOUNTER — Other Ambulatory Visit: Payer: Self-pay

## 2020-04-19 NOTE — Anesthesia Preprocedure Evaluation (Addendum)
Anesthesia Evaluation  Patient identified by MRN, date of birth, ID band Patient awake    Reviewed: Allergy & Precautions, NPO status , Patient's Chart, lab work & pertinent test results  History of Anesthesia Complications Negative for: history of anesthetic complications  Airway Mallampati: II  TM Distance: >3 FB Neck ROM: Full    Dental  (+) Dental Advisory Given   Pulmonary neg pulmonary ROS,  04/18/2020 SARS coronavirus NEG   breath sounds clear to auscultation       Cardiovascular negative cardio ROS   Rhythm:Regular Rate:Normal     Neuro/Psych negative neurological ROS     GI/Hepatic Neg liver ROS, GERD  Controlled,  Endo/Other  Morbid obesity  Renal/GU negative Renal ROS     Musculoskeletal  (+) Arthritis ,   Abdominal (+) + obese,   Peds  Hematology negative hematology ROS (+)   Anesthesia Other Findings   Reproductive/Obstetrics                            Anesthesia Physical Anesthesia Plan  ASA: II  Anesthesia Plan: General   Post-op Pain Management: GA combined w/ Regional for post-op pain   Induction: Intravenous  PONV Risk Score and Plan: 2 and Ondansetron and Dexamethasone  Airway Management Planned: LMA  Additional Equipment:   Intra-op Plan:   Post-operative Plan:   Informed Consent: I have reviewed the patients History and Physical, chart, labs and discussed the procedure including the risks, benefits and alternatives for the proposed anesthesia with the patient or authorized representative who has indicated his/her understanding and acceptance.     Dental advisory given  Plan Discussed with: CRNA and Surgeon  Anesthesia Plan Comments: (Plan routine monitors, GA- LMA OK, adductor canal and popliteal blocks for post op analgesia)       Anesthesia Quick Evaluation

## 2020-04-19 NOTE — Progress Notes (Signed)
Spoke with pt for pre-op call. Pt denies cardiac history or HTN. States he's Pre-Diabetic. Last A1C was 6.4 on 04/05/20. He states he does not check his blood sugar at home. Instructed pt not to eat food after midnight tonight, may have clear liquids until 4:30 AM. Pt has chosen not to come get the Pre-Surgery Ensure due to the fact it is high in carbs. He states he is only drinking water to get some weight off.   Covid test done 04/18/20 and it is negative. Pt states he's been in quarantine since the test was done and understands that he needs to stay in quarantine until he comes to the hospital in the morning. He voiced understanding.

## 2020-04-20 ENCOUNTER — Encounter (HOSPITAL_COMMUNITY)
Admission: RE | Disposition: A | Discharge status: Home / Self Care | Payer: Self-pay | Source: Home / Self Care | Attending: Orthopedic Surgery

## 2020-04-20 ENCOUNTER — Ambulatory Visit (HOSPITAL_COMMUNITY): Payer: Commercial Managed Care - PPO | Admitting: Anesthesiology

## 2020-04-20 ENCOUNTER — Ambulatory Visit (HOSPITAL_COMMUNITY)
Admission: RE | Admit: 2020-04-20 | Discharge: 2020-04-20 | Disposition: A | Discharge status: Home / Self Care | Payer: Commercial Managed Care - PPO | Attending: Orthopedic Surgery | Admitting: Orthopedic Surgery

## 2020-04-20 ENCOUNTER — Telehealth: Payer: Self-pay

## 2020-04-20 ENCOUNTER — Encounter (HOSPITAL_COMMUNITY): Payer: Self-pay | Admitting: Orthopedic Surgery

## 2020-04-20 ENCOUNTER — Other Ambulatory Visit: Payer: Self-pay

## 2020-04-20 DIAGNOSIS — T1490XS Injury, unspecified, sequela: Secondary | ICD-10-CM | POA: Insufficient documentation

## 2020-04-20 DIAGNOSIS — L309 Dermatitis, unspecified: Secondary | ICD-10-CM | POA: Diagnosis not present

## 2020-04-20 DIAGNOSIS — M12571 Traumatic arthropathy, right ankle and foot: Secondary | ICD-10-CM | POA: Diagnosis not present

## 2020-04-20 DIAGNOSIS — Z791 Long term (current) use of non-steroidal anti-inflammatories (NSAID): Secondary | ICD-10-CM | POA: Diagnosis not present

## 2020-04-20 DIAGNOSIS — I8393 Asymptomatic varicose veins of bilateral lower extremities: Secondary | ICD-10-CM | POA: Diagnosis not present

## 2020-04-20 DIAGNOSIS — Z6837 Body mass index (BMI) 37.0-37.9, adult: Secondary | ICD-10-CM | POA: Diagnosis not present

## 2020-04-20 DIAGNOSIS — X501XXS Overexertion from prolonged static or awkward postures, sequela: Secondary | ICD-10-CM | POA: Diagnosis not present

## 2020-04-20 HISTORY — DX: Pneumonia, unspecified organism: J18.9

## 2020-04-20 HISTORY — DX: Prediabetes: R73.03

## 2020-04-20 HISTORY — DX: Family history of other specified conditions: Z84.89

## 2020-04-20 HISTORY — PX: ANKLE FUSION: SHX5718

## 2020-04-20 LAB — BASIC METABOLIC PANEL
Anion gap: 8 (ref 5–15)
BUN: 15 mg/dL (ref 6–20)
CO2: 26 mmol/L (ref 22–32)
Calcium: 9.3 mg/dL (ref 8.9–10.3)
Chloride: 104 mmol/L (ref 98–111)
Creatinine, Ser: 0.93 mg/dL (ref 0.61–1.24)
GFR calc Af Amer: >60 mL/min (ref >60)
GFR calc non Af Amer: >60 mL/min (ref >60)
Glucose, Bld: 133 mg/dL — ABNORMAL HIGH (ref 70–99)
Potassium: 4.3 mmol/L (ref 3.5–5.1)
Sodium: 138 mmol/L (ref 135–145)

## 2020-04-20 LAB — CBC
HCT: 46.9 % (ref 39.0–52.0)
Hemoglobin: 16.4 g/dL (ref 13.0–17.0)
MCH: 33.6 pg (ref 26.0–34.0)
MCHC: 35 g/dL (ref 30.0–36.0)
MCV: 96.1 fL (ref 80.0–100.0)
Platelets: 103 10*3/uL — ABNORMAL LOW (ref 150–400)
RBC: 4.88 MIL/uL (ref 4.22–5.81)
RDW: 13.2 % (ref 11.5–15.5)
WBC: 7.4 10*3/uL (ref 4.0–10.5)
nRBC: 0 % (ref 0.0–0.2)

## 2020-04-20 LAB — GLUCOSE, CAPILLARY: Glucose-Capillary: 116 mg/dL — ABNORMAL HIGH (ref 70–99)

## 2020-04-20 SURGERY — ANKLE FUSION
Anesthesia: General | Site: Ankle | Laterality: Right

## 2020-04-20 MED ORDER — MIDAZOLAM HCL 5 MG/5ML IJ SOLN
INTRAMUSCULAR | Status: DC | PRN
  Administered 2020-04-20: 2 mg via INTRAVENOUS

## 2020-04-20 MED ORDER — SCOPOLAMINE 1 MG/3DAYS TD PT72
1.0000 | MEDICATED_PATCH | Freq: Once | TRANSDERMAL | Status: DC
  Administered 2020-04-20: 1.5 mg via TRANSDERMAL
  Filled 2020-04-20: qty 1

## 2020-04-20 MED ORDER — LIDOCAINE 2% (20 MG/ML) 5 ML SYRINGE
INTRAMUSCULAR | Status: AC
  Filled 2020-04-20: qty 5

## 2020-04-20 MED ORDER — LIDOCAINE 2% (20 MG/ML) 5 ML SYRINGE
INTRAMUSCULAR | Status: DC | PRN
  Administered 2020-04-20: 20 mg via INTRAVENOUS

## 2020-04-20 MED ORDER — FENTANYL CITRATE (PF) 250 MCG/5ML IJ SOLN
INTRAMUSCULAR | Status: AC
  Filled 2020-04-20: qty 5

## 2020-04-20 MED ORDER — 0.9 % SODIUM CHLORIDE (POUR BTL) OPTIME
TOPICAL | Status: DC | PRN
  Administered 2020-04-20: 07:00:00 1000 mL

## 2020-04-20 MED ORDER — DEXAMETHASONE SODIUM PHOSPHATE 10 MG/ML IJ SOLN
INTRAMUSCULAR | Status: AC
  Filled 2020-04-20: qty 1

## 2020-04-20 MED ORDER — MIDAZOLAM HCL 2 MG/2ML IJ SOLN: 0.5000 mg | Freq: Once | INTRAMUSCULAR | Status: DC | PRN

## 2020-04-20 MED ORDER — CLINDAMYCIN PHOSPHATE 900 MG/50ML IV SOLN
900.0000 mg | INTRAVENOUS | Status: AC
  Administered 2020-04-20: 900 mg via INTRAVENOUS
  Filled 2020-04-20: qty 50

## 2020-04-20 MED ORDER — ONDANSETRON HCL 4 MG/2ML IJ SOLN
INTRAMUSCULAR | Status: DC | PRN
  Administered 2020-04-20: 4 mg via INTRAVENOUS

## 2020-04-20 MED ORDER — PROPOFOL 10 MG/ML IV BOLUS
INTRAVENOUS | Status: AC
  Filled 2020-04-20: qty 40

## 2020-04-20 MED ORDER — DEXAMETHASONE SODIUM PHOSPHATE 4 MG/ML IJ SOLN
INTRAMUSCULAR | Status: DC | PRN
  Administered 2020-04-20: 10 mg via INTRAVENOUS

## 2020-04-20 MED ORDER — MEPERIDINE HCL 25 MG/ML IJ SOLN: 6.2500 mg | INTRAMUSCULAR | Status: DC | PRN

## 2020-04-20 MED ORDER — PROPOFOL 10 MG/ML IV BOLUS
INTRAVENOUS | Status: DC | PRN
  Administered 2020-04-20: 200 mg via INTRAVENOUS

## 2020-04-20 MED ORDER — ACETAMINOPHEN 500 MG PO TABS
1000.0000 mg | ORAL_TABLET | Freq: Once | ORAL | Status: AC
  Administered 2020-04-20: 1000 mg via ORAL
  Filled 2020-04-20: qty 2

## 2020-04-20 MED ORDER — ONDANSETRON HCL 4 MG/2ML IJ SOLN
INTRAMUSCULAR | Status: AC
  Filled 2020-04-20: qty 2

## 2020-04-20 MED ORDER — FENTANYL CITRATE (PF) 100 MCG/2ML IJ SOLN
INTRAMUSCULAR | Status: DC | PRN
  Administered 2020-04-20: 25 ug via INTRAVENOUS
  Administered 2020-04-20: 100 ug via INTRAVENOUS
  Administered 2020-04-20: 50 ug via INTRAVENOUS
  Administered 2020-04-20: 25 ug via INTRAVENOUS

## 2020-04-20 MED ORDER — PROMETHAZINE HCL 25 MG/ML IJ SOLN: 6.2500 mg | INTRAMUSCULAR | Status: DC | PRN

## 2020-04-20 MED ORDER — OXYCODONE HCL 5 MG/5ML PO SOLN: 5.0000 mg | Freq: Once | ORAL | Status: DC | PRN

## 2020-04-20 MED ORDER — OXYCODONE-ACETAMINOPHEN 5-325 MG PO TABS: 1.0000 | ORAL_TABLET | ORAL | 0 refills | Status: DC | PRN

## 2020-04-20 MED ORDER — ASCRIPTIN 325 MG PO TABS: 1.0000 | ORAL_TABLET | Freq: Every day | ORAL | 2 refills | Status: AC

## 2020-04-20 MED ORDER — MIDAZOLAM HCL 2 MG/2ML IJ SOLN
INTRAMUSCULAR | Status: AC
  Filled 2020-04-20: qty 2

## 2020-04-20 MED ORDER — BUPIVACAINE-EPINEPHRINE (PF) 0.5% -1:200000 IJ SOLN
INTRAMUSCULAR | Status: DC | PRN
Start: 2020-04-20 — End: 2020-04-20
  Administered 2020-04-20: 30 mL via PERINEURAL

## 2020-04-20 MED ORDER — OXYCODONE HCL 5 MG PO TABS: 5.0000 mg | ORAL_TABLET | Freq: Once | ORAL | Status: DC | PRN

## 2020-04-20 MED ORDER — ROPIVACAINE HCL 7.5 MG/ML IJ SOLN
INTRAMUSCULAR | Status: DC | PRN
Start: 2020-04-20 — End: 2020-04-20
  Administered 2020-04-20: 20 mL via PERINEURAL

## 2020-04-20 MED ORDER — HYDROMORPHONE HCL 1 MG/ML IJ SOLN: 0.2500 mg | INTRAMUSCULAR | Status: DC | PRN

## 2020-04-20 MED ORDER — LACTATED RINGERS IV SOLN: INTRAVENOUS | Status: DC | PRN

## 2020-04-20 SURGICAL SUPPLY — 52 items
BANDAGE ESMARK 6X9 LF (GAUZE/BANDAGES/DRESSINGS) IMPLANT
BIT DRILL CANN LNG FLUTE 3.0 (BIT) ×1 IMPLANT
BIT DRILL CANNULATED 3.0 (BIT) ×2
BIT DRILL SOLID LONG 5.5 (BIT) ×2 IMPLANT
BLADE AVERAGE 25X9 (BLADE) ×2 IMPLANT
BLADE SAW SGTL MED 73X18.5 STR (BLADE) ×2 IMPLANT
BLADE SURG 10 STRL SS (BLADE) IMPLANT
BNDG COHESIVE 4X5 TAN STRL (GAUZE/BANDAGES/DRESSINGS) IMPLANT
BNDG ESMARK 6X9 LF (GAUZE/BANDAGES/DRESSINGS)
BNDG GAUZE ELAST 4 BULKY (GAUZE/BANDAGES/DRESSINGS) IMPLANT
COVER MAYO STAND STRL (DRAPES) IMPLANT
COVER SURGICAL LIGHT HANDLE (MISCELLANEOUS) ×2 IMPLANT
COVER WAND RF STERILE (DRAPES) IMPLANT
DRAPE INCISE IOBAN 66X45 STRL (DRAPES) ×4 IMPLANT
DRAPE OEC MINIVIEW 54X84 (DRAPES) IMPLANT
DRAPE U-SHAPE 47X51 STRL (DRAPES) ×2 IMPLANT
DRSG ADAPTIC 3X8 NADH LF (GAUZE/BANDAGES/DRESSINGS) IMPLANT
DURAPREP 26ML APPLICATOR (WOUND CARE) ×2 IMPLANT
ELECT REM PT RETURN 9FT ADLT (ELECTROSURGICAL) ×2
ELECTRODE REM PT RTRN 9FT ADLT (ELECTROSURGICAL) ×1 IMPLANT
GAUZE SPONGE 4X4 12PLY STRL (GAUZE/BANDAGES/DRESSINGS) IMPLANT
GLOVE BIOGEL PI IND STRL 9 (GLOVE) ×1 IMPLANT
GLOVE BIOGEL PI INDICATOR 9 (GLOVE) ×1
GLOVE SURG ORTHO 9.0 STRL STRW (GLOVE) ×2 IMPLANT
GOWN STRL REUS W/ TWL LRG LVL3 (GOWN DISPOSABLE) ×3 IMPLANT
GOWN STRL REUS W/ TWL XL LVL3 (GOWN DISPOSABLE) ×1 IMPLANT
GOWN STRL REUS W/TWL LRG LVL3 (GOWN DISPOSABLE) ×6
GOWN STRL REUS W/TWL XL LVL3 (GOWN DISPOSABLE) ×2
KIT BASIN OR (CUSTOM PROCEDURE TRAY) ×2 IMPLANT
KIT TURNOVER KIT B (KITS) ×2 IMPLANT
NS IRRIG 1000ML POUR BTL (IV SOLUTION) ×2 IMPLANT
PACK ORTHO EXTREMITY (CUSTOM PROCEDURE TRAY) ×2 IMPLANT
PAD ARMBOARD 7.5X6 YLW CONV (MISCELLANEOUS) ×4 IMPLANT
PLATE ANKLE FUSION 4H RT (Plate) ×2 IMPLANT
PREVENA RESTOR AXIOFORM 29X28 (GAUZE/BANDAGES/DRESSINGS) ×2 IMPLANT
PUTTY DBM ALLOSYNC PURE 5CC (Putty) ×2 IMPLANT
SCREW BONE TI LP 5.5X50 (Screw) ×2 IMPLANT
SCREW LOW PRO TI 4.5X34 (Screw) ×2 IMPLANT
SCREW LOW PROFILE 4.5X28 (Screw) ×4 IMPLANT
SCREW LOW PROFILE TI 4.5X26 (Screw) ×4 IMPLANT
SCREW LP TI 4.5X22 (Screw) ×2 IMPLANT
SPONGE LAP 18X18 RF (DISPOSABLE) ×2 IMPLANT
SUCTION FRAZIER HANDLE 10FR (MISCELLANEOUS) ×2
SUCTION FRAZIER TIP 8 FR DISP (SUCTIONS) ×2
SUCTION TUBE FRAZIER 10FR DISP (MISCELLANEOUS) ×1 IMPLANT
SUCTION TUBE FRAZIER 8FR DISP (SUCTIONS) ×1 IMPLANT
SUT ETHILON 2 0 PSLX (SUTURE) ×6 IMPLANT
SYR 5ML LL (SYRINGE) ×2 IMPLANT
TOWEL GREEN STERILE (TOWEL DISPOSABLE) ×2 IMPLANT
TOWEL GREEN STERILE FF (TOWEL DISPOSABLE) ×2 IMPLANT
TUBE CONNECTING 12X1/4 (SUCTIONS) ×2 IMPLANT
WATER STERILE IRR 1000ML POUR (IV SOLUTION) ×2 IMPLANT

## 2020-04-20 NOTE — H&P (Signed)
[name] is an 54 y.o. male.   Chief Complaint: Right Ankle Pain HPI:  This is a pleasant gentleman who comes in complaining of right ankle pain.  He thinks he is always had bad ankle but about a year ago he stepped in a hole and twisted his ankle.  Because of Covid he did not have this addressed.  He is now having pain from the ankle radiating into his foot Past Medical History:  Diagnosis Date  . Allergy   . Arthritis    osteoarthritis  . Pneumonia   . Pre-diabetes     Past Surgical History:  Procedure Laterality Date  . THUMB AMPUTATION     left thumb reattached in High School    Family History  Problem Relation Age of Onset  . Cancer Mother        Lung Cancer  . COPD Father   . Heart disease Brother        CAD  . COPD Brother   . Heart disease Brother   . Colon cancer Neg Hx   . Prostate cancer Neg Hx    Social History:  reports that he has never smoked. He has never used smokeless tobacco. He reports current alcohol use. He reports that he does not use drugs.  Allergies:  Allergies  Allergen Reactions  . Penicillins     "I thought I was having a heart attack"    Medications Prior to Admission  Medication Sig Dispense Refill  . acetaminophen (TYLENOL) 325 MG tablet Take 650 mg by mouth every 6 (six) hours as needed for headache.    . ibuprofen (ADVIL) 200 MG tablet Take 600 mg by mouth 3 (three) times daily as needed for headache or moderate pain.    . Melatonin 10 MG CAPS Take 10 mg by mouth at bedtime as needed (sleep).    . meloxicam (MOBIC) 15 MG tablet Take 15 mg by mouth daily.    . Skin Protectants, Misc. (EUCERIN) cream Apply topically as needed for dry skin. (Patient taking differently: Apply 1 application topically as needed (itching). ) 454 g 1  . Triamcinolone Acetonide ([employer] NASAL ALLERGY SPRAY NA) Place 1 spray into the nose daily as needed (allergies).    Marland Kitchen sulfamethoxazole-trimethoprim (BACTRIM DS) 800-160 MG tablet Take 1 tablet by mouth 2  (two) times daily. (Patient not taking: Reported on 04/12/2020) 14 tablet 0  . triamcinolone cream (KENALOG) 0.1 % Apply 1 application topically 4 (four) times daily. (Patient not taking: Reported on 04/12/2020) 30 g 3    Results for orders placed or performed during the hospital encounter of 04/18/20 (from the past 48 hour(s))  SARS CORONAVIRUS 2 (TAT 6-24 HRS) Nasopharyngeal Nasopharyngeal Swab     Status: None   Collection Time: 04/18/20  2:53 PM   Specimen: Nasopharyngeal Swab  Result Value Ref Range   SARS Coronavirus 2 NEGATIVE NEGATIVE    Comment: (NOTE) SARS-CoV-2 target nucleic acids are NOT DETECTED. The SARS-CoV-2 RNA is generally detectable in upper and lower respiratory specimens during the acute phase of infection. Negative results do not preclude SARS-CoV-2 infection, do not rule out co-infections with other pathogens, and should not be used as the sole basis for treatment or other patient management decisions. Negative results must be combined with clinical observations, patient history, and epidemiological information. The expected result is Negative. Fact Sheet for Patients: HairSlick.no Fact Sheet for Healthcare Providers: quierodirigir.com This test is not yet approved or cleared by the [place] FDA  and  has been authorized for detection and/or diagnosis of SARS-CoV-2 by FDA under an Emergency Use Authorization (EUA). This EUA will remain  in effect (meaning this test can be used) for the duration of the COVID-19 declaration under Section 56 4(b)(1) of the Act, 21 U.S.C. section 360bbb-3(b)(1), unless the authorization is terminated or revoked sooner. Performed at [facility], [place] [address], [place], [place] [address]    No results found.  Review of Systems  All other systems reviewed and are negative.   Blood pressure (!) 158/78, pulse 65, temperature 98 F (36.7 C), temperature source Oral,  resp. rate 18, height 5\' 8"  (1.727 m), weight 111.1 kg, SpO2 100 %. Physical Exam  Patient is alert, oriented, no adenopathy, well-dressed, normal affect, normal respiratory effort. Right ankle: Mild soft tissue swelling no cellulitis distal pulses are palpable.  He does have some subtalar range of motion which seems to be painless.  Ankle range of motion is rigid and he does not even come to a neutral position.  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  Anterior ankle has a small open lesion which the patient says it is a dermatitis that he has had but is improving Lungs Clear heart RRR Assessment/Plan 1. Pain in right ankle and joints of right foot     Plan: Discussed options.  He does have some varicosities we have encouraged him to try compression stockings and the appropriate size.  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  Ultimately he may be best served by ankle fusion surgery.  Because of his current job situation he does not think he would be able to take 3 months off from his job but he will take this into consideration   [name], [place] 04/20/2020, 6:42 AM

## 2020-04-20 NOTE — Telephone Encounter (Signed)
FYI   Pt's wife called and stated she only received one container for the pump. I spoke with Nita Sells that stated we had extras for her to come pick up. Wife informed and would try to come today to pick this up today if not she will come first thing tomorrow am. She also stated he needed a one week f/u. I helped them make this for next week with [facility].

## 2020-04-20 NOTE — Progress Notes (Signed)
Orthopedic Tech Progress Note Patient Details:  [name] Jul 16, 1966 [id number]  Ortho Devices Type of Ortho Device: CAM walker, Crutches Ortho Device/Splint Location: RLE Ortho Device/Splint Interventions: Application, Adjustment   Post Interventions Patient Tolerated: Well Instructions Provided: Care of device, Poper ambulation with device, Adjustment of device   [name] [name] 04/20/2020, 9:50 AM

## 2020-04-20 NOTE — Anesthesia Postprocedure Evaluation (Signed)
Anesthesia Post Note  Patient: [name]  Procedure(s) Performed: RIGHT ANKLE FUSION (Right Ankle)     Patient location during evaluation: PACU Anesthesia Type: General and Regional Level of consciousness: awake and alert, oriented and patient cooperative Pain management: pain level controlled Vital Signs Assessment: post-procedure vital signs reviewed and stable Respiratory status: spontaneous breathing, nonlabored ventilation and respiratory function stable Cardiovascular status: blood pressure returned to baseline and stable Postop Assessment: no apparent nausea or vomiting Anesthetic complications: no    Last Vitals:  Vitals:   04/20/20 0958 04/20/20 1000  BP: 110/69   Pulse: 61 64  Resp: 17 18  Temp: 36.7 C (P) 36.4 C  SpO2: 97% 99%    Last Pain:  Vitals:   04/20/20 1000  TempSrc:   PainSc: (P) 0-No pain                 [name],E. Bernyce Brimley

## 2020-04-20 NOTE — Anesthesia Procedure Notes (Signed)
Anesthesia Regional Block: Popliteal block   Pre-Anesthetic Checklist: ,, timeout performed, Correct Patient, Correct Site, Correct Laterality, Correct Procedure, Correct Position, site marked, Risks and benefits discussed,  Surgical consent,  Pre-op evaluation,  At surgeon's request and post-op pain management  Laterality: Right and Lower  Prep: chloraprep       Needles:  Injection technique: Single-shot  Needle Type: Echogenic Stimulator Needle     Needle Length: 9cm  Needle Gauge: 21     Additional Needles:   Procedures:, nerve stimulator,,, ultrasound used (permanent image in chart),,,,   Nerve Stimulator or Paresthesia:  Response: toe twitches, 0.4 mA, 0.1 ms,   Additional Responses:   Narrative:  Start time: 04/20/2020 7:15 AM End time: 04/20/2020 7:21 AM Injection made incrementally with aspirations every 5 mL.  Performed by: Personally  Anesthesiologist: Jairo Ben, MD  Additional Notes: Pt identified in Holding room.  Monitors applied. Working IV access confirmed. Sterile prep R lateral knee.  #21ga ECHOgenic PNS to toe twitches at 0.54mA threshold.  30cc 0.5% Bupivacaine with 1:200k epi injected incrementally after negative test dose.  Patient asymptomatic, VSS, no heme aspirated, tolerated well.  Sandford Craze, MD

## 2020-04-20 NOTE — Anesthesia Procedure Notes (Signed)
Procedure Name: LMA Insertion Date/Time: 04/20/2020 7:40 AM Performed by: Jed Limerick, CRNA Pre-anesthesia Checklist: Patient identified, Emergency Drugs available, Suction available and Patient being monitored Patient Re-evaluated:Patient Re-evaluated prior to induction Oxygen Delivery Method: Circle System Utilized Preoxygenation: Pre-oxygenation with 100% oxygen Induction Type: IV induction LMA: LMA inserted LMA Size: 5.0 Number of attempts: 1 Placement Confirmation: positive ETCO2 Tube secured with: Tape Dental Injury: Teeth and Oropharynx as per pre-operative assessment

## 2020-04-20 NOTE — Transfer of Care (Signed)
Immediate Anesthesia Transfer of Care Note  Patient: [name]  Procedure(s) Performed: RIGHT ANKLE FUSION (Right Ankle)  Patient Location: PACU  Anesthesia Type:GA combined with regional for post-op pain  Level of Consciousness: drowsy  Airway & Oxygen Therapy: Patient Spontanous Breathing and Patient connected to face mask oxygen  Post-op Assessment: Report given to RN and Post -op Vital signs reviewed and stable  Post vital signs: Reviewed and stable  Last Vitals:  Vitals Value Taken Time  BP    Temp    Pulse 70 04/20/20 0858  Resp 19 04/20/20 0858  SpO2 100 % 04/20/20 0858  Vitals shown include unvalidated device data.  Last Pain:  Vitals:   04/20/20 0656  TempSrc:   PainSc: 0-No pain         Complications: No apparent anesthesia complications

## 2020-04-20 NOTE — Anesthesia Procedure Notes (Signed)
Anesthesia Regional Block: Adductor canal block   Pre-Anesthetic Checklist: ,, timeout performed, Correct Patient, Correct Site, Correct Laterality, Correct Procedure, Correct Position, site marked, Risks and benefits discussed,  Surgical consent,  Pre-op evaluation,  At surgeon's request and post-op pain management  Laterality: Right and Lower  Prep: chloraprep       Needles:  Injection technique: Single-shot  Needle Type: Echogenic Needle     Needle Length: 9cm  Needle Gauge: 21     Additional Needles:   Procedures:,,,, ultrasound used (permanent image in chart),,,,  Narrative:  Start time: 04/20/2020 7:06 AM End time: 04/20/2020 7:13 AM Injection made incrementally with aspirations every 5 mL.  Performed by: Personally  Anesthesiologist: Jairo Ben, MD  Additional Notes: Pt identified in Holding room.  Monitors applied. Working IV access confirmed. Sterile prep R thigh.  #21ga ECHOgenic needle into adductor canal with US guidance.  20cc 0.75% Ropivacaine injected incrementally after negative test dose.  Patient asymptomatic, VSS, no heme aspirated, tolerated well.  Sandford Craze, MD

## 2020-04-20 NOTE — Op Note (Signed)
04/20/2020  8:57 AM  PATIENT:  [name]    PRE-OPERATIVE DIAGNOSIS:  Traumatic Arthritis Right Ankle  POST-OPERATIVE DIAGNOSIS:  Same  PROCEDURE:  RIGHT ANKLE FUSION C-arm fluoroscopy to verify reduction. Application of demineralized bone matrix. Axial form Prevena wound VAC.  SURGEON:  Nadara Mustard, MD  PHYSICIAN ASSISTANT:None ANESTHESIA:   General  PREOPERATIVE INDICATIONS:  [name] is a  54 y.o. male with a diagnosis of Traumatic Arthritis Right Ankle who failed conservative measures and elected for surgical management.    The risks benefits and alternatives were discussed with the patient preoperatively including but not limited to the risks of infection, bleeding, nerve injury, cardiopulmonary complications, the need for revision surgery, among others, and the patient was willing to proceed.  OPERATIVE IMPLANTS: 5 cc demineralized bone matrix.  Prevena wound VAC.  @ENCIMAGES @  OPERATIVE FINDINGS: Extensive arthritis and bony spurs C-arm fluoroscopy verified reduction of the ankle joint  OPERATIVE PROCEDURE: Patient was brought the operating room after undergoing a regional block he underwent a general anesthetic.  After adequate levels anesthesia were obtained patient's right lower extremity was prepped using DuraPrep draped in the sterile field a timeout was called.  was used to cover all exposed skin.  A midline incision was made this was carried down through the interval between the anterior tibial tendon and the EHL.  The anterior tibial tendon sheath was incised dorsally and posteriorly subperiosteal dissection was used to expose the tibial talar joint.  Patient had extensive osteophytic bone spurs.  An oscillating saw was used to to excise the bony spurs as well as a rondure.  Once the joint line was cleansed a oscillating saw was used make cuts perpendicular to the axis of the tibia.  A rondure curette were used to further debride the joint.  The articular  surfaces had good bleeding subchondral bone.  The ankle joint was reduced at 90 degrees.  The anterior Arthrex plate was applied this was secured distally with 2 compression screws a proximal screw was placed in the oblong hole to provide further compression and a lag screw was then placed obliquely across the tibial talar joint.  Additional screw was placed proximally and 2 screws distally.  See arthroscopy verified reduction of the tibial talar joint.  The bone graft was packed within the tibial talar joint prior to reduction.  The wound was irrigated with normal saline the retinaculum was closed using 2-0 Vicryl skin was closed using 2-0 nylon the axial form Prevena wound VAC was applied this had a good suction fit patient was extubated taken the PACU in stable condition   DISCHARGE PLANNING:  Antibiotic duration: Preoperative antibiotics  Weightbearing: Nonweightbearing on the right  Pain medication: Prescription for Percocet  Dressing care/ Wound VAC: Continue wound VAC for 1 week  Ambulatory devices: Crutches  Discharge to: Home.  Follow-up: In the office 1 week post operative.

## 2020-04-21 ENCOUNTER — Encounter: Payer: Self-pay | Admitting: *Deleted

## 2020-04-27 ENCOUNTER — Encounter: Payer: Self-pay | Admitting: Physician Assistant

## 2020-04-27 ENCOUNTER — Other Ambulatory Visit: Payer: Self-pay

## 2020-04-27 ENCOUNTER — Other Ambulatory Visit: Payer: Self-pay | Admitting: Physician Assistant

## 2020-04-27 ENCOUNTER — Ambulatory Visit (INDEPENDENT_AMBULATORY_CARE_PROVIDER_SITE_OTHER): Payer: Commercial Managed Care - PPO | Admitting: Physician Assistant

## 2020-04-27 VITALS — Ht 68.0 in | Wt 245.0 lb

## 2020-04-27 DIAGNOSIS — M25562 Pain in left knee: Secondary | ICD-10-CM | POA: Diagnosis not present

## 2020-04-27 DIAGNOSIS — G8929 Other chronic pain: Secondary | ICD-10-CM | POA: Diagnosis not present

## 2020-04-27 MED ORDER — LIDOCAINE HCL 1 % IJ SOLN
1.0000 mL | INTRAMUSCULAR | Status: AC | PRN
  Administered 2020-04-27: 1 mL

## 2020-04-27 MED ORDER — OXYCODONE-ACETAMINOPHEN 5-325 MG PO TABS: 1.0000 | ORAL_TABLET | ORAL | 0 refills | Status: DC | PRN

## 2020-04-27 MED ORDER — METHYLPREDNISOLONE ACETATE 40 MG/ML IJ SUSP
40.0000 mg | INTRAMUSCULAR | Status: AC | PRN
  Administered 2020-04-27: 40 mg via INTRA_ARTICULAR

## 2020-04-27 NOTE — Progress Notes (Signed)
Office Visit Note   Patient: [name]           Date of Birth: July 10, 1966           MRN: [id number] Visit Date: 04/27/2020              Requested by: Tonia Ghent, MD [address] [place],  [place] [address] PCP: Tonia Ghent, MD  Chief Complaint  Patient presents with  . Right Ankle - Routine Post Op    04/20/20 right ankle fusion       HPI: Patient presents today approximately 1 week status post right ankle arthrodesis.  He is turned the wound VAC on and off a couple times.  He denies fever, chills, or calf pain.  Of note he is wearing a knee immobilizer on the left knee.  He states that his left knee has been bothering him for a while but when he was mowing the lawn prior to his ankle fusion the knee was painful and gave out.  He did go to the emergency room and x-rays were taken.  he does find the immobilizer uncomfortable and feels better when his knee is bent  Assessment & Plan: Visit Diagnoses: No diagnosis found.  Plan: He will begin dry daily dressing changes in 48 hours.  He will continue to be nonweightbearing on the right.  With regards to the left knee I did review his x-rays from the emergency room and he has severe advanced tricompartmental arthritis of the knee.  I have offered him an injection today so he can use his knee a bit more with hopefully less pain.  I also think he might benefit better from a hinged knee brace rather than the immobilizer and I have given him a prescription for this.  He will follow-up in 1 week at which time x-rays of his ankle should be taken  Follow-Up Instructions: No follow-ups on file.   Ortho Exam  Patient is alert, oriented, no adenopathy, well-dressed, normal affect, normal respiratory effort. Focused examination of his right ankle demonstrates healing surgical incision.  He does have some very slight wound dehiscence in the proximal end of the wound the wound edges are healthy and there is bleeding coming from  this area.  Swelling is well controlled compartments are soft and compressible  Focused examination of his left knee demonstrates no mobility of the patella joint.  No effusion no cellulitis crepitus with range of motion  Imaging: No results found. No images are attached to the encounter.  Labs: Lab Results  Component Value Date   HGBA1C 6.4 04/05/2020   HGBA1C 5.1 06/07/2011   HGBA1C 7.5 (H) 12/05/2010   ESRSEDRATE 5 03/30/2020   CRP 1.0 (H) 03/30/2020     Lab Results  Component Value Date   ALBUMIN 3.8 03/30/2020    No results found for: MG No results found for: VD25OH  No results found for: PREALBUMIN CBC EXTENDED Latest Ref Rng & Units 04/20/2020 03/30/2020  WBC 4.0 - 10.5 K/uL 7.4 9.3  RBC 4.22 - 5.81 MIL/uL 4.88 5.08  HGB 13.0 - 17.0 g/dL 16.4 16.6  HCT 39.0 - 52.0 % 46.9 46.9  PLT 150 - 400 K/uL 103(L) 152  NEUTROABS 1.7 - 7.7 K/uL - 4.9  LYMPHSABS 0.7 - 4.0 K/uL - 2.7     Body mass index is 37.25 kg/m.  Orders:  No orders of the defined types were placed in this encounter.  No orders of the  defined types were placed in this encounter.    Procedures: Large Joint Inj: L knee on 04/27/2020 1:51 PM Indications: pain and diagnostic evaluation Details: 22 G 1.5 in needle, anterolateral approach  Arthrogram: No  Medications: 40 mg methylPREDNISolone acetate 40 MG/ML; 1 mL lidocaine 1 % Outcome: tolerated well, no immediate complications Procedure, treatment alternatives, risks and benefits explained, specific risks discussed. Consent was given by the patient.      Clinical Data: No additional findings.  ROS:  All other systems negative, except as noted in the HPI. Review of Systems  Objective: Vital Signs: Ht 5\' 8"  (1.727 m)   Wt 245 lb (111.1 kg)   BMI 37.25 kg/m   Specialty Comments:  No specialty comments available.  PMFS History: Patient Active Problem List   Diagnosis Date Noted  . Traumatic arthritis of ankle, right   . Advance care  planning 04/06/2020  . Abdominal wall defect, acquired 04/06/2020  . Ankle pain 04/06/2020  . Routine general medical examination at a health care facility 06/15/2014  . Shoulder pain 01/03/2013  . Back pain 09/03/2012  . History of hyperglycemia 06/07/2011  . ALLERGIC RHINITIS 11/03/2010  . OSTEOARTHRITIS 11/03/2010  . SKIN RASH 11/03/2010   Past Medical History:  Diagnosis Date  . Allergy   . Arthritis    osteoarthritis  . Family history of adverse reaction to anesthesia    brother sleep apnea  . Pneumonia   . Pre-diabetes     Family History  Problem Relation Age of Onset  . Cancer Mother        Lung Cancer  . COPD Father   . Heart disease Brother        CAD  . COPD Brother   . Heart disease Brother   . Colon cancer Neg Hx   . Prostate cancer Neg Hx     Past Surgical History:  Procedure Laterality Date  . ANKLE FUSION Right 04/20/2020   Procedure: RIGHT ANKLE FUSION;  Surgeon: 04/22/2020, MD;  Location: [facility];  Service: Orthopedics;  Laterality: Right;  . 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 Not on file  Tobacco Use  . Smoking status: Never Smoker  . Smokeless tobacco: Never Used  Substance and Sexual Activity  . Alcohol use: Yes    Comment: occ beer  . Drug use: No  . Sexual activity: Not on file

## 2020-05-04 ENCOUNTER — Ambulatory Visit (INDEPENDENT_AMBULATORY_CARE_PROVIDER_SITE_OTHER): Payer: Commercial Managed Care - PPO | Admitting: Physician Assistant

## 2020-05-04 ENCOUNTER — Other Ambulatory Visit: Payer: Self-pay

## 2020-05-04 ENCOUNTER — Encounter: Payer: Self-pay | Admitting: Physician Assistant

## 2020-05-04 ENCOUNTER — Ambulatory Visit (INDEPENDENT_AMBULATORY_CARE_PROVIDER_SITE_OTHER): Payer: Commercial Managed Care - PPO

## 2020-05-04 VITALS — Ht 68.0 in | Wt 245.0 lb

## 2020-05-04 DIAGNOSIS — M25571 Pain in right ankle and joints of right foot: Secondary | ICD-10-CM | POA: Diagnosis not present

## 2020-05-04 MED ORDER — OXYCODONE-ACETAMINOPHEN 5-325 MG PO TABS: 1.0000 | ORAL_TABLET | ORAL | 0 refills | Status: DC | PRN

## 2020-05-04 NOTE — Progress Notes (Signed)
Office Visit Note   Patient: [name]           Date of Birth: [birth year]           MRN: [id number] Visit Date: 05/04/2020              Requested by: Joaquim Nam, MD [address] [place],  [place] [address] PCP: Joaquim Nam, MD  Chief Complaint  Patient presents with  . Right Ankle - Routine Post Op    04/20/20 right ankle fusion       HPI: This is a pleasant gentleman who is 2 weeks status post right ankle fusion.  He denies fever, chills, or calf pain.  He admits he has been full weightbearing on his ankle.  He knows he is doing more than he should but he finds it very difficult to be sedentary.  He is taking pain medication in reasonable amounts  Assessment & Plan: Visit Diagnoses:  1. Pain in right ankle and joints of right foot     Plan: He will follow up in 1 week for suture removal Follow-Up Instructions: No follow-ups on file.   Ortho Exam  Patient is alert, oriented, no adenopathy, well-dressed, normal affect, normal respiratory effort. Focused examination of his incision demonstrates moderate soft tissue swelling.  Incision is healing and there is no surrounding cellulitis minimal serous drainage compartments compressible.  Imaging: No results found. No images are attached to the encounter.  Labs: Lab Results  Component Value Date   HGBA1C 6.4 04/05/2020   HGBA1C 5.1 06/07/2011   HGBA1C 7.5 (H) 12/05/2010   ESRSEDRATE 5 03/30/2020   CRP 1.0 (H) 03/30/2020     Lab Results  Component Value Date   ALBUMIN 3.8 03/30/2020    No results found for: MG No results found for: VD25OH  No results found for: PREALBUMIN CBC EXTENDED Latest Ref Rng & Units 04/20/2020 03/30/2020  WBC 4.0 - 10.5 K/uL 7.4 9.3  RBC 4.22 - 5.81 MIL/uL 4.88 5.08  HGB 13.0 - 17.0 g/dL 93.2 35.5  HCT 73.2 - 20.2 % 46.9 46.9  PLT 150 - 400 K/uL 103(L) 152  NEUTROABS 1.7 - 7.7 K/uL - 4.9  LYMPHSABS 0.7 - 4.0 K/uL - 2.7     Body mass index is 37.25  kg/m.  Orders:  Orders Placed This Encounter  Procedures  . XR Ankle Complete Right   Meds ordered this encounter  Medications  . oxyCODONE-acetaminophen (PERCOCET) 5-325 MG tablet    Sig: Take 1 tablet by mouth every 4 (four) hours as needed.    Dispense:  30 tablet    Refill:  0     Procedures: No procedures performed  Clinical Data: No additional findings.  ROS:  All other systems negative, except as noted in the HPI. Review of Systems  Objective: Vital Signs: Ht 5\' 8"  (1.727 m)   Wt 245 lb (111.1 kg)   BMI 37.25 kg/m   Specialty Comments:  No specialty comments available.  PMFS History: Patient Active Problem List   Diagnosis Date Noted  . Traumatic arthritis of ankle, right   . Advance care planning 04/06/2020  . Abdominal wall defect, acquired 04/06/2020  . Ankle pain 04/06/2020  . Routine general medical examination at a health care facility 06/15/2014  . Shoulder pain 01/03/2013  . Back pain 09/03/2012  . History of hyperglycemia 06/07/2011  . ALLERGIC RHINITIS 11/03/2010  . OSTEOARTHRITIS 11/03/2010  . SKIN RASH 11/03/2010   Past  Medical History:  Diagnosis Date  . Allergy   . Arthritis    osteoarthritis  . Family history of adverse reaction to anesthesia    brother sleep apnea  . Pneumonia   . Pre-diabetes     Family History  Problem Relation Age of Onset  . Cancer Mother        Lung Cancer  . COPD Father   . Heart disease Brother        CAD  . COPD Brother   . Heart disease Brother   . Colon cancer Neg Hx   . Prostate cancer Neg Hx     Past Surgical History:  Procedure Laterality Date  . ANKLE FUSION Right 04/20/2020   Procedure: RIGHT ANKLE FUSION;  Surgeon: Newt Minion, MD;  Location: [facility];  Service: Orthopedics;  Laterality: Right;  . 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 Not on file  Tobacco Use  . Smoking status: Never Smoker  . Smokeless tobacco:  Never Used  Substance and Sexual Activity  . Alcohol use: Yes    Comment: occ beer  . Drug use: No  . Sexual activity: Not on file

## 2020-05-10 ENCOUNTER — Telehealth: Payer: Self-pay

## 2020-05-10 NOTE — Telephone Encounter (Signed)
Pt has been dealing w/recent ankle fusion.  Pt requests 04-05-20 Gen Surg referral closed for now.  He will c/b when he ready to see [facility].

## 2020-05-11 ENCOUNTER — Other Ambulatory Visit: Payer: Self-pay

## 2020-05-11 ENCOUNTER — Encounter: Payer: Self-pay | Admitting: Physician Assistant

## 2020-05-11 ENCOUNTER — Ambulatory Visit (INDEPENDENT_AMBULATORY_CARE_PROVIDER_SITE_OTHER): Payer: Commercial Managed Care - PPO | Admitting: Physician Assistant

## 2020-05-11 VITALS — Ht 68.0 in | Wt 245.0 lb

## 2020-05-11 DIAGNOSIS — M12571 Traumatic arthropathy, right ankle and foot: Secondary | ICD-10-CM

## 2020-05-11 NOTE — Progress Notes (Signed)
Office Visit Note   Patient: [name]           Date of Birth: 1966-12-15           MRN: [id number] Visit Date: 05/11/2020              Requested by: Tonia Ghent, MD [address] [place],  [place] [address] PCP: Tonia Ghent, MD  Chief Complaint  Patient presents with  . Right Ankle - Routine Post Op    04/20/20 right ankle fusion       HPI: Patient is 3 weeks status post right ankle arthrodesis he has been better this week about elevating his leg and taking it easy.  He is placing weight on his foot but is doing it with the assistance of crutches and in his boot  Assessment & Plan: Visit Diagnoses: No diagnosis found.  Plan: Patient will follow up in 3 weeks at that time x-rays of his right ankle should be taken  Follow-Up Instructions: No follow-ups on file.   Ortho Exam  Patient is alert, oriented, no adenopathy, well-dressed, normal affect, normal respiratory effort. Right ankle significant decrease in swelling.  No cellulitis incision is healed with well opposed wound edges no necrosis no surrounding cellulitis.  Sutures were harvested without difficulty  Imaging: No results found. No images are attached to the encounter.  Labs: Lab Results  Component Value Date   HGBA1C 6.4 04/05/2020   HGBA1C 5.1 06/07/2011   HGBA1C 7.5 (H) 12/05/2010   ESRSEDRATE 5 03/30/2020   CRP 1.0 (H) 03/30/2020     Lab Results  Component Value Date   ALBUMIN 3.8 03/30/2020    No results found for: MG No results found for: VD25OH  No results found for: PREALBUMIN CBC EXTENDED Latest Ref Rng & Units 04/20/2020 03/30/2020  WBC 4.0 - 10.5 K/uL 7.4 9.3  RBC 4.22 - 5.81 MIL/uL 4.88 5.08  HGB 13.0 - 17.0 g/dL 16.4 16.6  HCT 39.0 - 52.0 % 46.9 46.9  PLT 150 - 400 K/uL 103(L) 152  NEUTROABS 1.7 - 7.7 K/uL - 4.9  LYMPHSABS 0.7 - 4.0 K/uL - 2.7     Body mass index is 37.25 kg/m.  Orders:  No orders of the defined types were placed in this encounter.  No  orders of the defined types were placed in this encounter.    Procedures: No procedures performed  Clinical Data: No additional findings.  ROS:  All other systems negative, except as noted in the HPI. Review of Systems  Objective: Vital Signs: Ht 5\' 8"  (1.727 m)   Wt 245 lb (111.1 kg)   BMI 37.25 kg/m   Specialty Comments:  No specialty comments available.  PMFS History: Patient Active Problem List   Diagnosis Date Noted  . Traumatic arthritis of ankle, right   . Advance care planning 04/06/2020  . Abdominal wall defect, acquired 04/06/2020  . Ankle pain 04/06/2020  . Routine general medical examination at a health care facility 06/15/2014  . Shoulder pain 01/03/2013  . Back pain 09/03/2012  . History of hyperglycemia 06/07/2011  . ALLERGIC RHINITIS 11/03/2010  . OSTEOARTHRITIS 11/03/2010  . SKIN RASH 11/03/2010   Past Medical History:  Diagnosis Date  . Allergy   . Arthritis    osteoarthritis  . Family history of adverse reaction to anesthesia    brother sleep apnea  . Pneumonia   . Pre-diabetes     Family History  Problem Relation Age of  Onset  . Cancer Mother        Lung Cancer  . COPD Father   . Heart disease Brother        CAD  . COPD Brother   . Heart disease Brother   . Colon cancer Neg Hx   . Prostate cancer Neg Hx     Past Surgical History:  Procedure Laterality Date  . ANKLE FUSION Right 04/20/2020   Procedure: RIGHT ANKLE FUSION;  Surgeon: Nadara Mustard, MD;  Location: [facility] OR;  Service: Orthopedics;  Laterality: Right;  . 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 Not on file  Tobacco Use  . Smoking status: Never Smoker  . Smokeless tobacco: Never Used  Substance and Sexual Activity  . Alcohol use: Yes    Comment: occ beer  . Drug use: No  . Sexual activity: Not on file

## 2020-05-11 NOTE — Telephone Encounter (Signed)
Noted. Thanks.

## 2020-05-26 ENCOUNTER — Encounter: Payer: Self-pay | Admitting: Family Medicine

## 2020-05-27 NOTE — Telephone Encounter (Signed)
I called pt and no answer; left v/m requesting pt to call [facility].

## 2020-05-27 NOTE — Telephone Encounter (Signed)
I left v/m requesting pt call [facility] again. DPR does not give permission to speak with anyone else.

## 2020-05-27 NOTE — Telephone Encounter (Signed)
See my chart message regarding positive Covid test.  Please triage patient.  Thanks.

## 2020-05-28 ENCOUNTER — Encounter: Payer: Self-pay | Admitting: Family Medicine

## 2020-06-01 ENCOUNTER — Other Ambulatory Visit: Payer: Self-pay

## 2020-06-01 ENCOUNTER — Ambulatory Visit (INDEPENDENT_AMBULATORY_CARE_PROVIDER_SITE_OTHER): Payer: Commercial Managed Care - PPO | Admitting: Physician Assistant

## 2020-06-01 ENCOUNTER — Ambulatory Visit: Payer: Commercial Managed Care - PPO | Admitting: Physician Assistant

## 2020-06-01 ENCOUNTER — Encounter: Payer: Self-pay | Admitting: Physician Assistant

## 2020-06-01 ENCOUNTER — Ambulatory Visit (INDEPENDENT_AMBULATORY_CARE_PROVIDER_SITE_OTHER): Payer: Commercial Managed Care - PPO

## 2020-06-01 VITALS — Ht 68.0 in | Wt 245.0 lb

## 2020-06-01 DIAGNOSIS — M12571 Traumatic arthropathy, right ankle and foot: Secondary | ICD-10-CM

## 2020-06-01 DIAGNOSIS — M25571 Pain in right ankle and joints of right foot: Secondary | ICD-10-CM | POA: Diagnosis not present

## 2020-06-01 NOTE — Progress Notes (Signed)
Office Visit Note   Patient: [name]           Date of Birth: [birth year]           MRN: [id number] Visit Date: 06/01/2020              Requested by: Joaquim Nam, MD [address] [place],  [place] [address] PCP: Joaquim Nam, MD  Chief Complaint  Patient presents with  . Right Ankle - Routine Post Op    04/20/20 right ankle fusion         HPI: This is a pleasant gentleman who is 6 weeks status post right ankle arthrodesis.  He has been trying to wean out of the boot but can only do this for short periods of time he still has difficulties with his left knee.  He does have a history of advanced arthritis of the left knee.  He did have an injection which gave him very limited relief  Assessment & Plan: Visit Diagnoses:  1. Pain in right ankle and joints of right foot   2. Traumatic arthritis of ankle, right     Plan: He will follow up in 4 weeks time.  He will continue to work on weaning out of the boot.  I did offer him another injection today but he just does not think it was helpful enough.  We also briefly discussed hyaluronic acid injections again he does not wish to go forward with this as he is still awaiting a brace for his knee  Follow-Up Instructions: No follow-ups on file.   Ortho Exam  Patient is alert, oriented, no adenopathy, well-dressed, normal affect, normal respiratory effort. Right ankle: Well-healed surgical incision.  Mild soft tissue swelling no cellulitis distal pulses are intact.  Ankle range of motion is rigid.  Good motion through the forefoot.  Imaging: No results found. No images are attached to the encounter.  Labs: Lab Results  Component Value Date   HGBA1C 6.4 04/05/2020   HGBA1C 5.1 06/07/2011   HGBA1C 7.5 (H) 12/05/2010   ESRSEDRATE 5 03/30/2020   CRP 1.0 (H) 03/30/2020     Lab Results  Component Value Date   ALBUMIN 3.8 03/30/2020    No results found for: MG No results found for: VD25OH  No results  found for: PREALBUMIN CBC EXTENDED Latest Ref Rng & Units 04/20/2020 03/30/2020  WBC 4.0 - 10.5 K/uL 7.4 9.3  RBC 4.22 - 5.81 MIL/uL 4.88 5.08  HGB 13.0 - 17.0 g/dL 78.2 42.3  HCT 53.6 - 14.4 % 46.9 46.9  PLT 150 - 400 K/uL 103(L) 152  NEUTROABS 1.7 - 7.7 K/uL - 4.9  LYMPHSABS 0.7 - 4.0 K/uL - 2.7     Body mass index is 37.25 kg/m.  Orders:  Orders Placed This Encounter  Procedures  . XR Ankle 2 Views Right   No orders of the defined types were placed in this encounter.    Procedures: No procedures performed  Clinical Data: No additional findings.  ROS:  All other systems negative, except as noted in the HPI. Review of Systems  Objective: Vital Signs: Ht 5\' 8"  (1.727 m)   Wt 245 lb (111.1 kg)   BMI 37.25 kg/m   Specialty Comments:  No specialty comments available.  PMFS History: Patient Active Problem List   Diagnosis Date Noted  . Traumatic arthritis of ankle, right   . Advance care planning 04/06/2020  . Abdominal wall defect, acquired 04/06/2020  .  Ankle pain 04/06/2020  . Routine general medical examination at a health care facility 06/15/2014  . Shoulder pain 01/03/2013  . Back pain 09/03/2012  . History of hyperglycemia 06/07/2011  . ALLERGIC RHINITIS 11/03/2010  . OSTEOARTHRITIS 11/03/2010  . SKIN RASH 11/03/2010   Past Medical History:  Diagnosis Date  . Allergy   . Arthritis    osteoarthritis  . Family history of adverse reaction to anesthesia    brother sleep apnea  . Pneumonia   . Pre-diabetes     Family History  Problem Relation Age of Onset  . Cancer Mother        Lung Cancer  . COPD Father   . Heart disease Brother        CAD  . COPD Brother   . Heart disease Brother   . Colon cancer Neg Hx   . Prostate cancer Neg Hx     Past Surgical History:  Procedure Laterality Date  . ANKLE FUSION Right 04/20/2020   Procedure: RIGHT ANKLE FUSION;  Surgeon: Newt Minion, MD;  Location: [facility];  Service: Orthopedics;  Laterality: Right;    . 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 Not on file  Tobacco Use  . Smoking status: Never Smoker  . Smokeless tobacco: Never Used  Substance and Sexual Activity  . Alcohol use: Yes    Comment: occ beer  . Drug use: No  . Sexual activity: Not on file

## 2020-06-02 NOTE — Telephone Encounter (Signed)
See 05/28/20 pt message.

## 2020-06-29 ENCOUNTER — Ambulatory Visit (INDEPENDENT_AMBULATORY_CARE_PROVIDER_SITE_OTHER): Payer: Commercial Managed Care - PPO | Admitting: Physician Assistant

## 2020-06-29 ENCOUNTER — Encounter: Payer: Self-pay | Admitting: Physician Assistant

## 2020-06-29 ENCOUNTER — Other Ambulatory Visit: Payer: Self-pay

## 2020-06-29 VITALS — Ht 68.0 in | Wt 245.0 lb

## 2020-06-29 DIAGNOSIS — M25571 Pain in right ankle and joints of right foot: Secondary | ICD-10-CM

## 2020-06-29 NOTE — Progress Notes (Signed)
Office Visit Note   Patient: [name]           Date of Birth: [birth year]           MRN: [id number] Visit Date: 06/29/2020              Requested by: Joaquim Nam, MD [address] [place],  [place] [address] PCP: Joaquim Nam, MD  Chief Complaint  Patient presents with  . Right Ankle - Routine Post Op    04/20/20 right ankle fusion       HPI: Patient is 10 weeks status post right ankle arthrodesis.  He is wearing a regular supportive shoe today he feels well.  Although he does not feel better than he did before surgery he is made a significant improvement in the last month.  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  Plan: With regards to the right ankle he may follow-up at the 10-month mark if he feels he needs it or sooner.  Ultimately for the left the best option would be a knee replacement but he is not ready to do this right now.  We will follow up if he wants to try another injection  Follow-Up Instructions: No follow-ups on file.   Ortho Exam  Patient is alert, oriented, no adenopathy, well-dressed, normal affect, normal respiratory effort. Right ankle mild soft tissue swelling well-healed surgical incision with just mild keloid formation pulses intact swelling controlled no cellulitis ankle range of motion is rigid  Imaging: No results found. No images are attached to the encounter.  Labs: Lab Results  Component Value Date   HGBA1C 6.4 04/05/2020   HGBA1C 5.1 06/07/2011   HGBA1C 7.5 (H) 12/05/2010   ESRSEDRATE 5 03/30/2020   CRP 1.0 (H) 03/30/2020     Lab Results  Component Value Date   ALBUMIN 3.8 03/30/2020    No results found for: MG No results found for: VD25OH  No results found for: PREALBUMIN CBC EXTENDED Latest Ref Rng & Units  04/20/2020 03/30/2020  WBC 4.0 - 10.5 K/uL 7.4 9.3  RBC 4.22 - 5.81 MIL/uL 4.88 5.08  HGB 13.0 - 17.0 g/dL 81.2 75.1  HCT 39 - 52 % 46.9 46.9  PLT 150 - 400 K/uL 103(L) 152  NEUTROABS 1.7 - 7.7 K/uL - 4.9  LYMPHSABS 0.7 - 4.0 K/uL - 2.7     Body mass index is 37.25 kg/m.  Orders:  No orders of the defined types were placed in this encounter.  No orders of the defined types were placed in this encounter.    Procedures: No procedures performed  Clinical Data: No additional findings.  ROS:  All other systems negative, except as noted in the HPI. Review of Systems  Objective: Vital Signs: Ht 5\' 8"  (1.727 m)   Wt 245 lb (111.1 kg)   BMI 37.25 kg/m   Specialty Comments:  No specialty comments available.  PMFS History: Patient Active Problem List   Diagnosis Date Noted  . Traumatic arthritis of ankle, right   . Advance care planning 04/06/2020  . Abdominal wall defect, acquired 04/06/2020  . Ankle pain 04/06/2020  . Routine general medical examination at a health care facility 06/15/2014  . Shoulder pain 01/03/2013  . Back pain 09/03/2012  . History  of hyperglycemia 06/07/2011  . ALLERGIC RHINITIS 11/03/2010  . OSTEOARTHRITIS 11/03/2010  . SKIN RASH 11/03/2010   Past Medical History:  Diagnosis Date  . Allergy   . Arthritis    osteoarthritis  . Family history of adverse reaction to anesthesia    brother sleep apnea  . Pneumonia   . Pre-diabetes     Family History  Problem Relation Age of Onset  . Cancer Mother        Lung Cancer  . COPD Father   . Heart disease Brother        CAD  . COPD Brother   . Heart disease Brother   . Colon cancer Neg Hx   . Prostate cancer Neg Hx     Past Surgical History:  Procedure Laterality Date  . ANKLE FUSION Right 04/20/2020   Procedure: RIGHT ANKLE FUSION;  Surgeon: Nadara Mustard, MD;  Location: [facility] OR;  Service: Orthopedics;  Laterality: Right;  . THUMB AMPUTATION     left thumb reattached in High School    Social History   Occupational History  . Not on file  Tobacco Use  . Smoking status: Never Smoker  . Smokeless tobacco: Never Used  Vaping Use  . Vaping Use: Never used  Substance and Sexual Activity  . Alcohol use: Yes    Comment: occ beer  . Drug use: No  . Sexual activity: Not on file

## 2020-06-30 ENCOUNTER — Telehealth: Payer: Self-pay | Admitting: Orthopedic Surgery

## 2020-06-30 ENCOUNTER — Telehealth: Payer: Self-pay | Admitting: Physician Assistant

## 2020-06-30 NOTE — Telephone Encounter (Signed)
error 

## 2020-06-30 NOTE — Telephone Encounter (Signed)
Note was done and patient picked up on 06/30/2020.

## 2020-06-30 NOTE — Telephone Encounter (Signed)
Patient called requesting a doctor's note to return to work on Monday's date July 12,2021 with no restriction. Patient stated he is on his way to pick up note. Explained to patient PA Person's has to respond and see if nurse can draw up note for return to work. Patient states he will be in parking lot and to call when note I finished and ready for pick up. Patient phone number is [phone]

## 2020-07-23 ENCOUNTER — Encounter: Payer: Self-pay | Admitting: Family Medicine

## 2020-07-27 ENCOUNTER — Telehealth: Payer: Self-pay | Admitting: Family Medicine

## 2020-07-27 NOTE — Telephone Encounter (Signed)
Please triage patient about his rash.  See my chart message.  I will be out of clinic today.  Thank you for taking care of this.  Routed to [place] in the meantime.

## 2020-07-27 NOTE — Telephone Encounter (Signed)
Still unable to reach pt by phone; will try to contact pt later.

## 2020-07-27 NOTE — Telephone Encounter (Signed)
Left v/m and sent my chart note for pt to call [facility] at [phone] option 4 for triage.

## 2020-07-27 NOTE — Telephone Encounter (Signed)
I spoke with pt pt said has had this type rash before and was told it is contact dermatitis. Pt said he sweats at work and pt thinks that is what has caused this rash that started out just on hands and has moved up lower arms to elbows and due to being out recently from work pt cannot leave work early for doctors appt.  Pt still has triamcinolone cream and has another refill of that at pharmacy. Pt is also using the Eucerin. Pt said last time was given a shot and pt thinks was prednisone or cortisone shot. Offered numerous appts in next wk with other providers but pt has to have appt after 3 pm and only wants to see Dr Para March. Pt is aware Dr Para March is not in office 07/27/20 -07/29/20.. pt said could send note to Dr Para March to see if could send in a med to help with rash and if not pt will see Dr Para March 08/04/20 at 3:30. UC & ED precautions given and pt voiced understanding. Sending note to Dr Para March to review.[facility]

## 2020-07-27 NOTE — Telephone Encounter (Signed)
Left v/m and my chart note requesting cb to [facility] triage.

## 2020-07-27 NOTE — Telephone Encounter (Signed)
It is hard to treat a rash w/o seeing it first.  He can try triamcinolone in the meantime. O/w would need OV either with me or another provider in the clinic. Thanks.

## 2020-07-28 NOTE — Telephone Encounter (Signed)
LVM but pt is already scheduled for 8/12. Just want to make sure pt does not have any other questions.

## 2020-08-01 NOTE — Telephone Encounter (Signed)
See 07/27/20 phone note.

## 2020-08-04 ENCOUNTER — Ambulatory Visit (INDEPENDENT_AMBULATORY_CARE_PROVIDER_SITE_OTHER): Payer: Commercial Managed Care - PPO | Admitting: Family Medicine

## 2020-08-04 ENCOUNTER — Other Ambulatory Visit: Payer: Self-pay

## 2020-08-04 ENCOUNTER — Encounter: Payer: Self-pay | Admitting: Family Medicine

## 2020-08-04 DIAGNOSIS — R21 Rash and other nonspecific skin eruption: Secondary | ICD-10-CM

## 2020-08-04 MED ORDER — CLOBETASOL PROPIONATE 0.05 % EX CREA: 1.0000 "application " | TOPICAL_CREAM | Freq: Two times a day (BID) | CUTANEOUS | 1 refills | Status: DC | PRN

## 2020-08-04 MED ORDER — PREDNISONE 20 MG PO TABS: ORAL_TABLET | ORAL | 0 refills | Status: DC

## 2020-08-04 NOTE — Patient Instructions (Signed)
Stop meloxicam while taking prednisone.  Prednisone with food.  Avoid sugary foods while on prednisone.   If needed, use clobetasol cream on the rash but not on areas of thin skin (face, groin).  Take care.  Glad to see you.

## 2020-08-04 NOTE — Progress Notes (Signed)
This visit occurred during the SARS-CoV-2 public health emergency.  Safety protocols were in place, including screening questions prior to the visit, additional usage of staff PPE, and extensive cleaning of exam room while observing appropriate contact time as indicated for disinfecting solutions.  Rash.  Itches more at night. Eucerin didn't help.  TAC helped only a little with the itching.  Waking up scratching.  No others with sx.  No FCNAVD.  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  When he was out of work with ankle surgery, he didn't have sx.  He didn't have improvement when he changed gloves.    He had ankle surgery.  He is still dealing with episodic swelling.  He is putting off knee surgery.    Meds, vitals, and allergies reviewed.   ROS: Per HPI unless specifically indicated in ROS section   nad ncat Walking with a limp at baseline Circumferential blanching erythematous rash on the forearms and dorsal hands.  No ulceration.

## 2020-08-08 NOTE — Assessment & Plan Note (Signed)
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  Discussed options.  Itching.  Does not appear infected. Prednisone with food.  Steroid cautions discussed with patient.  Avoid sugary foods while on prednisone.  If needed, use clobetasol cream on the rash but not on areas of thin skin (face, groin).  He agrees.

## 2020-08-18 ENCOUNTER — Encounter: Payer: Self-pay | Admitting: Family Medicine

## 2020-08-21 ENCOUNTER — Other Ambulatory Visit: Payer: Self-pay | Admitting: Family Medicine

## 2020-08-21 MED ORDER — PREDNISONE 20 MG PO TABS: ORAL_TABLET | ORAL | 0 refills | Status: DC

## 2020-10-26 ENCOUNTER — Encounter: Payer: Self-pay | Admitting: Family Medicine

## 2020-10-26 MED ORDER — CLOBETASOL PROPIONATE 0.05 % EX CREA: 1.0000 "application " | TOPICAL_CREAM | Freq: Two times a day (BID) | CUTANEOUS | 1 refills | Status: DC | PRN

## 2020-10-26 NOTE — Telephone Encounter (Signed)
Sent. Thanks.   

## 2020-10-26 NOTE — Telephone Encounter (Signed)
Last office visit 08/04/2020 for skin rash.  Last refilled 08/04/2020 for 60 g with 1 refill.  No future appointments.

## 2021-01-19 ENCOUNTER — Encounter: Payer: Self-pay | Admitting: Family Medicine

## 2021-03-25 ENCOUNTER — Encounter: Payer: Self-pay | Admitting: Family Medicine

## 2021-03-29 ENCOUNTER — Other Ambulatory Visit: Payer: Self-pay | Admitting: Family Medicine

## 2021-03-29 MED ORDER — CLOBETASOL PROPIONATE 0.05 % EX CREA: 1.0000 "application " | TOPICAL_CREAM | Freq: Two times a day (BID) | CUTANEOUS | 1 refills | Status: AC | PRN

## 2021-04-29 ENCOUNTER — Encounter: Payer: Self-pay | Admitting: Family Medicine

## 2021-05-10 IMAGING — DX DG FOOT COMPLETE 3+V*R*
3 series · 3 of 3 positions shown · non-contrast
Comparison: None

CLINICAL DATA: RIGHT foot pain, injury

EXAM:
RIGHT FOOT COMPLETE - 3+ VIEW

[foot ap]
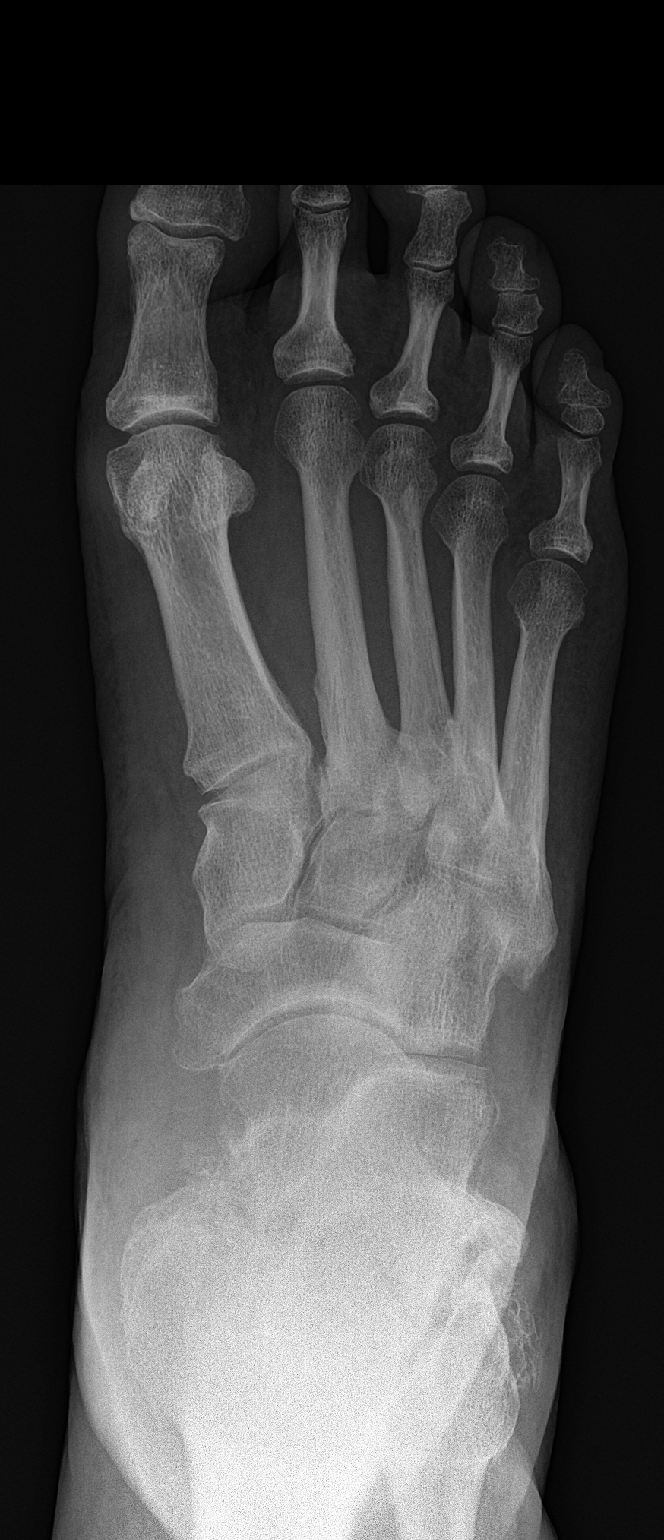

[foot obl]
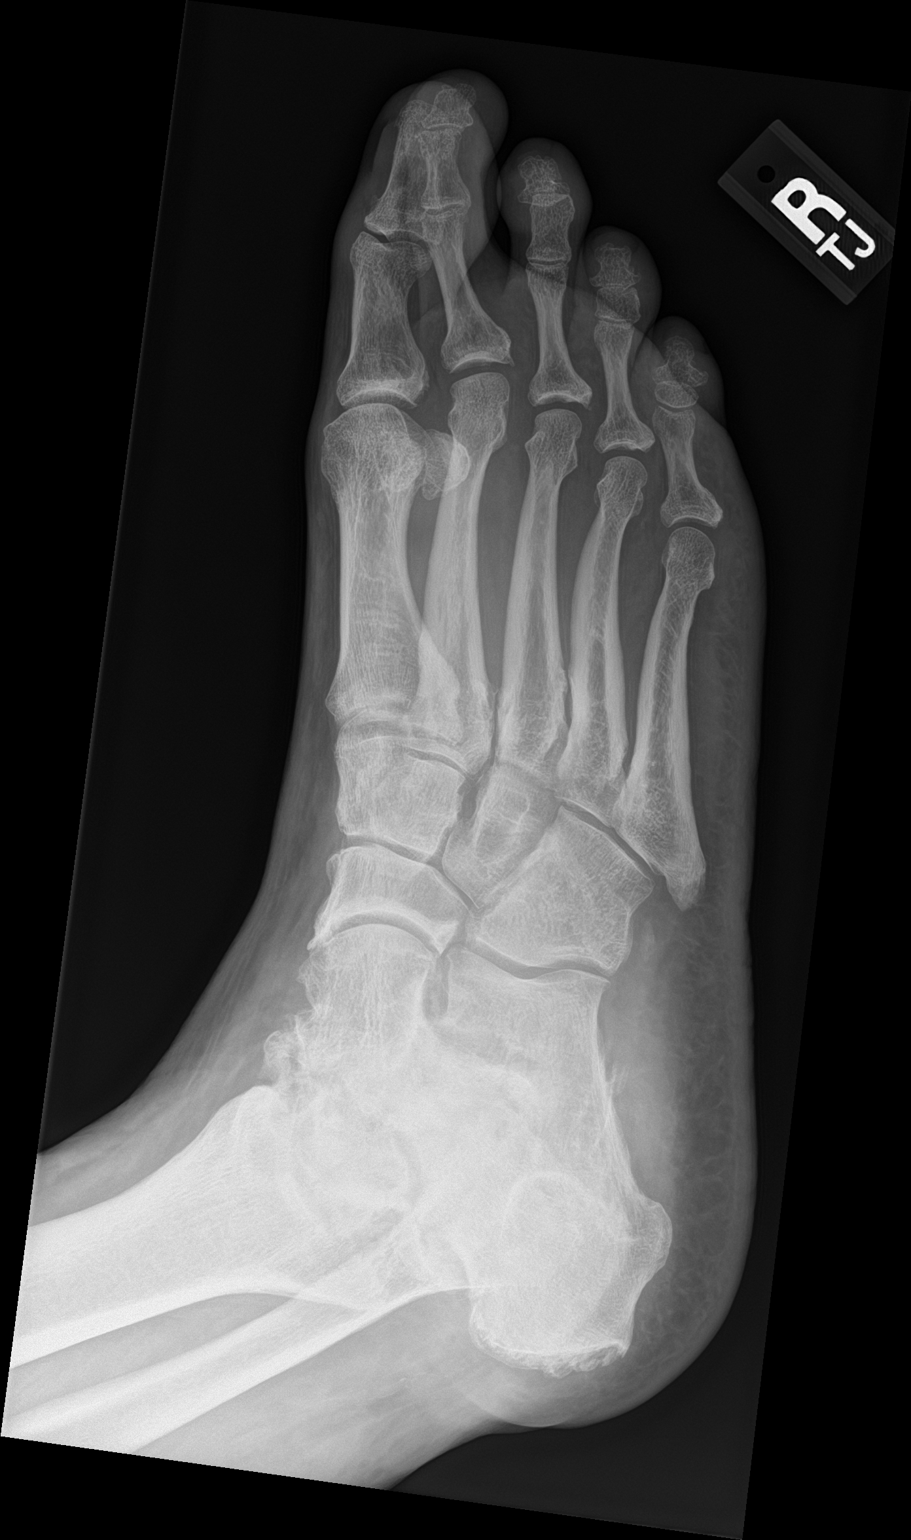

[foot lat]
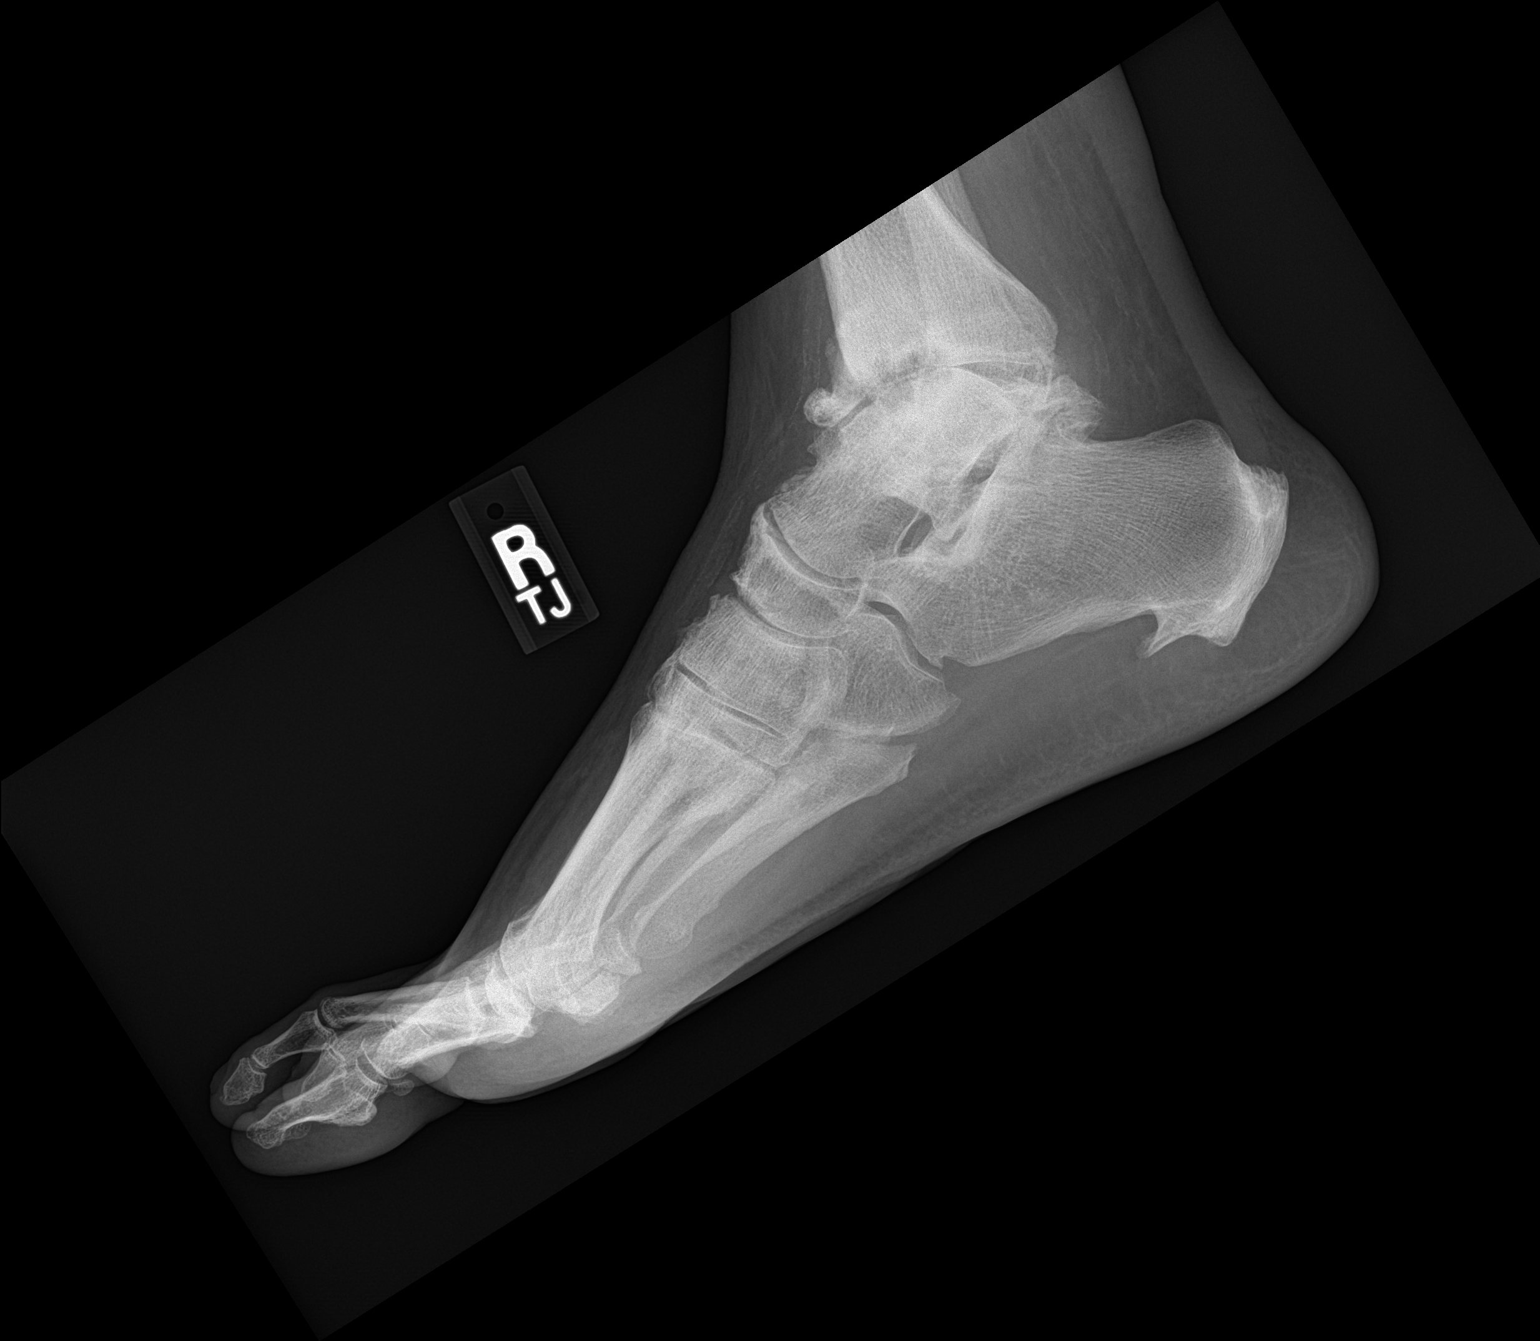

[3 of 3 positions shown; findings below may reference images not displayed]

FINDINGS: Osseous mineralization probably normal for technique.

Joint spaces preserved.

Soft tissue swelling medially.

No acute fracture, dislocation, or bone destruction.

Large plantar calcaneal spur.

Extensive degenerative changes of tibiotalar joint with joint space
narrowing, spur formation, subchondral cyst formation, and anterior
subluxation of the talus.

Small vessel vascular calcifications.
IMPRESSION: Calcaneal spurring.

Advanced tibiotalar degenerative changes with anterior talar
subluxation.

No acute abnormalities.

## 2021-08-10 ENCOUNTER — Encounter: Payer: Self-pay | Admitting: Family Medicine

## 2021-08-17 ENCOUNTER — Other Ambulatory Visit: Payer: Self-pay

## 2021-08-17 ENCOUNTER — Encounter: Payer: Self-pay | Admitting: Family Medicine

## 2021-08-17 ENCOUNTER — Ambulatory Visit: Payer: Commercial Managed Care - PPO | Admitting: Family Medicine

## 2021-08-17 VITALS — BP 120/82 | HR 73 | Temp 98.1°F | Ht 68.0 in | Wt 261.0 lb

## 2021-08-17 DIAGNOSIS — M7989 Other specified soft tissue disorders: Secondary | ICD-10-CM | POA: Diagnosis not present

## 2021-08-17 NOTE — Progress Notes (Signed)
This visit occurred during the SARS-CoV-2 public health emergency.  Safety protocols were in place, including screening questions prior to the visit, additional usage of staff PPE, and extensive cleaning of exam room while observing appropriate contact time as indicated for disinfecting solutions.  Hand rash got better stopping antibacterial hand soap.  I asked him to update me about the specific active ingredient/brand.  He declined checking on cologuard coverage/colon cancer screening at this point, d/w pt. encouraged colon cancer screening, discussed that he can decrease death from preventable cause of colon cancer.  R ankle swelling, ankle fusion done 03/2020.  Reports following post op instructions.  He has normal sensation except for incision site as expected.  No pain but dec ROM as expected.  Still with swelling and that was his main concern.  Swelling is better in the AM but not as good as the L ankle, meaning his right ankle is never small as his left.  No chest pain.  Meds, vitals, and allergies reviewed.   ROS: Per HPI unless specifically indicated in ROS section   Nad Ncat Rrr Ctab Right ankle and calf with 1+ swelling, is larger than the left.  Intact dorsalis pedis pulse in right foot.  Skin intact except for expected incision scar.  Normal sensation except at the scar as expected.  Decreased range of motion in the right ankle as expected

## 2021-08-17 NOTE — Patient Instructions (Signed)
You should get a call about scheduling the ultrasound.   If that is unremarkable, then wearing a compression stocking daily is likely the best option.  Take care.  Glad to see you.

## 2021-08-20 DIAGNOSIS — M7989 Other specified soft tissue disorders: Secondary | ICD-10-CM | POA: Insufficient documentation

## 2021-08-20 NOTE — Assessment & Plan Note (Addendum)
Longstanding, discussed getting outpatient ultrasound set up.  Ordered. If that is unremarkable, then wearing a compression stocking daily is likely the best option.

## 2022-02-05 ENCOUNTER — Encounter: Payer: Self-pay | Admitting: Family Medicine

## 2022-05-01 ENCOUNTER — Encounter: Payer: Self-pay | Admitting: Family Medicine

## 2022-05-01 ENCOUNTER — Ambulatory Visit: Payer: Commercial Managed Care - PPO | Admitting: Family Medicine

## 2022-05-01 VITALS — BP 132/80 | HR 81 | Temp 98.6°F | Ht 68.0 in | Wt 263.0 lb

## 2022-05-01 DIAGNOSIS — R739 Hyperglycemia, unspecified: Secondary | ICD-10-CM

## 2022-05-01 DIAGNOSIS — M25569 Pain in unspecified knee: Secondary | ICD-10-CM

## 2022-05-01 DIAGNOSIS — M1990 Unspecified osteoarthritis, unspecified site: Secondary | ICD-10-CM

## 2022-05-01 DIAGNOSIS — M958 Other specified acquired deformities of musculoskeletal system: Secondary | ICD-10-CM | POA: Diagnosis not present

## 2022-05-01 MED ORDER — MELOXICAM 15 MG PO TABS: 7.5000 mg | ORAL_TABLET | Freq: Every day | ORAL | 3 refills | Status: AC

## 2022-05-01 NOTE — Progress Notes (Signed)
Knee pain.  Pain walking, using a brace now. L>>R knee pain.  Prev L knee with DJD changes.  Previous images reviewed with patient.  No locking.  Using a hinged brace.  D/w pt about seeing ortho.   ? ?Abdominal wall hernia versus diastases recti prev noted.  He can get abdominal wall protrusion in the midline with straining. ? ?Meds, vitals, and allergies reviewed.  ? ?ROS: Per HPI unless specifically indicated in ROS section  ? ?Nad ?Ncat ?Diastasis recti noted, with abdominal strain.  Vertical oriented bulge in the midline noted with that. ?Rrr ?Ctab ?R knee with crepitus noted on ROM.  He has full extension but pain on flexion.   ?

## 2022-05-01 NOTE — Patient Instructions (Signed)
Meloxicam with food.  Don't take with ibuprofen or aleve.   ?We'll call about seeing ortho.  ?Go to the lab on the way out.   If you have mychart we'll likely use that to update you.    ?Take care.  Glad to see you. ?

## 2022-05-02 ENCOUNTER — Encounter: Payer: Self-pay | Admitting: Family Medicine

## 2022-05-02 LAB — HEMOGLOBIN A1C: Hgb A1c MFr Bld: 8.2 % — ABNORMAL HIGH (ref 4.6–6.5)

## 2022-05-02 LAB — BASIC METABOLIC PANEL
BUN: 12 mg/dL (ref 6–23)
CO2: 26 mEq/L (ref 19–32)
Calcium: 9.3 mg/dL (ref 8.4–10.5)
Chloride: 103 mEq/L (ref 96–112)
Creatinine, Ser: 0.99 mg/dL (ref 0.40–1.50)
GFR: 86.27 mL/min (ref >60.00)
Glucose, Bld: 176 mg/dL — ABNORMAL HIGH (ref 70–99)
Potassium: 4.5 mEq/L (ref 3.5–5.1)
Sodium: 138 mEq/L (ref 135–145)

## 2022-05-02 NOTE — Assessment & Plan Note (Signed)
Defer repair at this point since he does not have obstructive symptoms.  Routine cautions given to patient.  He wants to address his knee first. ?

## 2022-05-02 NOTE — Assessment & Plan Note (Signed)
Discussed options.  He can take meloxicam with food.  Advised not to take with ibuprofen or aleve.   ?We'll call about seeing ortho.  ?See notes on labs, to recheck creatinine and his A1c.  If he is diabetic it could affect his care going forward.  Discussed with patient. ?

## 2022-05-08 ENCOUNTER — Encounter: Payer: Self-pay | Admitting: Family Medicine

## 2022-05-13 NOTE — Telephone Encounter (Signed)
Please send order for meter lancets and strips, check sugar daily.  Diagnosis E11.9.  Thanks.  I do not know what his insurance will cover.

## 2022-05-17 ENCOUNTER — Encounter: Payer: Self-pay | Admitting: *Deleted
